# Patient Record
Sex: Male | Born: 1945 | Race: White | Hispanic: No | Smoking: Former smoker
Health system: Southern US, Community
[De-identification: ages and names within clinical notes are randomized; demographics above are authoritative.]

## PROBLEM LIST (undated history)

## (undated) DIAGNOSIS — Z87442 Personal history of urinary calculi: Secondary | ICD-10-CM

## (undated) DIAGNOSIS — D4959 Neoplasm of unspecified behavior of other genitourinary organ: Secondary | ICD-10-CM

## (undated) DIAGNOSIS — S3992XA Unspecified injury of lower back, initial encounter: Secondary | ICD-10-CM

## (undated) DIAGNOSIS — N189 Chronic kidney disease, unspecified: Secondary | ICD-10-CM

## (undated) DIAGNOSIS — Z9221 Personal history of antineoplastic chemotherapy: Secondary | ICD-10-CM

## (undated) DIAGNOSIS — T7840XA Allergy, unspecified, initial encounter: Secondary | ICD-10-CM

## (undated) DIAGNOSIS — C8330 Diffuse large B-cell lymphoma, unspecified site: Secondary | ICD-10-CM

## (undated) DIAGNOSIS — N400 Enlarged prostate without lower urinary tract symptoms: Secondary | ICD-10-CM

## (undated) DIAGNOSIS — Z923 Personal history of irradiation: Secondary | ICD-10-CM

## (undated) DIAGNOSIS — N452 Orchitis: Secondary | ICD-10-CM

## (undated) DIAGNOSIS — C801 Malignant (primary) neoplasm, unspecified: Secondary | ICD-10-CM

## (undated) DIAGNOSIS — G542 Cervical root disorders, not elsewhere classified: Secondary | ICD-10-CM

## (undated) HISTORY — DX: Orchitis: N45.2

## (undated) HISTORY — DX: Personal history of antineoplastic chemotherapy: Z92.21

## (undated) HISTORY — DX: Diffuse large B-cell lymphoma, unspecified site: C83.30

## (undated) HISTORY — DX: Benign prostatic hyperplasia without lower urinary tract symptoms: N40.0

## (undated) HISTORY — DX: Allergy, unspecified, initial encounter: T78.40XA

## (undated) HISTORY — DX: Chronic kidney disease, unspecified: N18.9

## (undated) HISTORY — PX: LITHOTRIPSY: SUR834

---

## 1998-07-05 ENCOUNTER — Inpatient Hospital Stay (HOSPITAL_COMMUNITY): Admission: EM | Admit: 1998-07-05 | Discharge: 1998-07-07 | Payer: Self-pay | Admitting: Emergency Medicine

## 1998-07-09 ENCOUNTER — Emergency Department (HOSPITAL_COMMUNITY): Admission: EM | Admit: 1998-07-09 | Discharge: 1998-07-10 | Payer: Self-pay

## 1998-07-11 ENCOUNTER — Observation Stay (HOSPITAL_COMMUNITY): Admission: EM | Admit: 1998-07-11 | Discharge: 1998-07-11 | Payer: Self-pay | Admitting: Emergency Medicine

## 2001-12-15 ENCOUNTER — Encounter: Payer: Self-pay | Admitting: Orthopedic Surgery

## 2001-12-15 ENCOUNTER — Encounter: Admission: RE | Admit: 2001-12-15 | Discharge: 2001-12-15 | Payer: Self-pay | Admitting: Orthopedic Surgery

## 2002-11-17 HISTORY — PX: CYSTOSCOPY/RETROGRADE/URETEROSCOPY/STONE EXTRACTION WITH BASKET: SHX5317

## 2004-07-05 ENCOUNTER — Ambulatory Visit (HOSPITAL_COMMUNITY): Admission: RE | Admit: 2004-07-05 | Discharge: 2004-07-05 | Payer: Self-pay | Admitting: Neurosurgery

## 2004-08-30 ENCOUNTER — Emergency Department (HOSPITAL_COMMUNITY): Admission: EM | Admit: 2004-08-30 | Discharge: 2004-08-30 | Payer: Self-pay | Admitting: Emergency Medicine

## 2007-12-20 ENCOUNTER — Emergency Department (HOSPITAL_COMMUNITY): Admission: EM | Admit: 2007-12-20 | Discharge: 2007-12-20 | Payer: Self-pay | Admitting: Emergency Medicine

## 2011-07-22 ENCOUNTER — Other Ambulatory Visit: Payer: Self-pay | Admitting: Family Medicine

## 2011-07-22 DIAGNOSIS — N508 Other specified disorders of male genital organs: Secondary | ICD-10-CM

## 2011-07-24 ENCOUNTER — Ambulatory Visit
Admission: RE | Admit: 2011-07-24 | Discharge: 2011-07-24 | Disposition: A | Discharge status: Home / Self Care | Payer: Managed Care, Other (non HMO) | Source: Ambulatory Visit | Attending: Family Medicine | Admitting: Family Medicine

## 2011-07-24 DIAGNOSIS — N508 Other specified disorders of male genital organs: Secondary | ICD-10-CM

## 2011-08-08 LAB — URINE MICROSCOPIC-ADD ON

## 2011-08-08 LAB — URINALYSIS, ROUTINE W REFLEX MICROSCOPIC
Leukocytes, UA: NEGATIVE
Nitrite: NEGATIVE
Specific Gravity, Urine: 1.026
pH: 5.5

## 2011-08-08 LAB — POCT URINE HEMOGLOBIN: Operator id: 25670

## 2011-10-14 ENCOUNTER — Other Ambulatory Visit (HOSPITAL_COMMUNITY): Payer: Self-pay | Admitting: Urology

## 2011-10-14 DIAGNOSIS — D4959 Neoplasm of unspecified behavior of other genitourinary organ: Secondary | ICD-10-CM

## 2011-10-15 ENCOUNTER — Ambulatory Visit (HOSPITAL_COMMUNITY)
Admission: RE | Admit: 2011-10-15 | Discharge: 2011-10-15 | Disposition: A | Discharge status: Home / Self Care | Payer: Managed Care, Other (non HMO) | Source: Ambulatory Visit | Attending: Urology | Admitting: Urology

## 2011-10-15 DIAGNOSIS — I861 Scrotal varices: Secondary | ICD-10-CM | POA: Insufficient documentation

## 2011-10-15 DIAGNOSIS — N5089 Other specified disorders of the male genital organs: Secondary | ICD-10-CM | POA: Insufficient documentation

## 2011-10-15 DIAGNOSIS — N433 Hydrocele, unspecified: Secondary | ICD-10-CM | POA: Insufficient documentation

## 2011-10-15 DIAGNOSIS — D4959 Neoplasm of unspecified behavior of other genitourinary organ: Secondary | ICD-10-CM

## 2011-10-20 ENCOUNTER — Other Ambulatory Visit (HOSPITAL_COMMUNITY): Payer: Self-pay | Admitting: Urology

## 2011-10-20 DIAGNOSIS — D4959 Neoplasm of unspecified behavior of other genitourinary organ: Secondary | ICD-10-CM

## 2011-10-28 ENCOUNTER — Ambulatory Visit (HOSPITAL_COMMUNITY)
Admission: RE | Admit: 2011-10-28 | Discharge: 2011-10-28 | Disposition: A | Discharge status: Home / Self Care | Payer: Managed Care, Other (non HMO) | Source: Ambulatory Visit | Attending: Urology | Admitting: Urology

## 2011-10-28 ENCOUNTER — Other Ambulatory Visit (HOSPITAL_COMMUNITY): Payer: Managed Care, Other (non HMO)

## 2011-10-28 DIAGNOSIS — I861 Scrotal varices: Secondary | ICD-10-CM | POA: Insufficient documentation

## 2011-10-28 DIAGNOSIS — N508 Other specified disorders of male genital organs: Secondary | ICD-10-CM | POA: Insufficient documentation

## 2011-10-28 DIAGNOSIS — D4959 Neoplasm of unspecified behavior of other genitourinary organ: Secondary | ICD-10-CM

## 2011-10-28 MED ORDER — GADOBENATE DIMEGLUMINE 529 MG/ML IV SOLN: 20.0000 mL | Freq: Once | INTRAVENOUS | Status: AC | PRN

## 2011-10-29 ENCOUNTER — Other Ambulatory Visit: Payer: Self-pay | Admitting: Urology

## 2011-10-30 ENCOUNTER — Encounter (HOSPITAL_BASED_OUTPATIENT_CLINIC_OR_DEPARTMENT_OTHER): Payer: Self-pay | Admitting: *Deleted

## 2011-10-30 NOTE — H&P (Addendum)
Urology Admission H&P  Chief Complaint: left testicle swelling  History of Present Illness:   65 y/o white male presents for follow up left testicular swelling.  He first noted the testicular swelling and tenderness Sept 2012. He was initially evaluated with his PCP.  He had a scrotal U/S at [facility] 07/24/11 which showed blood flow bilateral testicles, normal epididymes and the left testicle appeared larger than the right and with increased blood flow as compared to the right testicle.  No intratesticular lesions were noted and this was felt most likely orchitis.  He was given Cipro 500mg  po BID x10 days then Bactrim DS for 3 weeks. His pain improved but testicle remained indurated. His UA was clear. He has a history of stones with prior surgery needed for stones. 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 No vasectomy, scrotal or prostate surgery. The patient has chosen a surveillance approach, but his testicle has remained hard and enlarged and his imaging has progressed from indicative of infection to neoplasm ([place] -> [place] -> [place] -> MRI).   PMH: nephrolithiasis PSH: lithotripsy  Home Medications:  Advil, ASA  Allergies: morphine, motrin  No family history on file. Social History: former smoker, divorced, caffeine use, retired Review of Systems  All other systems reviewed and are negative.    Physical Exam:  Vital signs in last 24 hours:   Physical Exam  Vitals reviewed. Constitutional: He is oriented to person, place, and time. He appears well-developed and well-nourished.  HENT:  Head: Normocephalic.  Eyes: Pupils are equal, round, and reactive to light.  Neck: Normal range of motion. Neck supple.  Cardiovascular: Normal rate and normal heart sounds.   Respiratory: Effort normal and breath sounds normal.  GI: Soft. There is no tenderness.  Genitourinary: Penis normal.  Neurological: He is alert and oriented to person,  place, and time.  Skin: Skin is warm and dry.  Psychiatric: He has a normal mood and affect. His behavior is normal.  Scrotum: Left testicle is very hard and enlarged. Right testicle normal without mass.   Laboratory Data:  CMP, AFP, LDH normal in office, 10/30/2011   Impression/Assessment:  Left testicle neoplasm  Plan:  I discussed with the patient the nature, potential benefits, risks and alternatives to left inguinal exploration, testicle biopsy with frozen section and left radical inguinal orchiectomy, including side effects of the proposed treatment, the likelihood of the patient achieving the goals of the procedure, and any potential problems that might occur during the procedure or recuperation. We discussed if cancer on frozen we will take the entire testicle. He wanted to know if I could wake him up to tell him I was going to remove the entire testicle and we discussed we needed to make that decision now. He said he didn't want to lose the testicle and that he wanted to keep it if the frozens are negative. We even frozen sections can be falsely negative and miss cancer. This may delay diagnosis, allow for cancer to progress, be more difficult to treat in the future and/or be incurable. All questions answered. Patient elects to proceed. Again, he wants to keep testicle if frozens are negative.    Antony Haste  H&P update - pt seen and examined today. No change in H&P.

## 2011-10-30 NOTE — Progress Notes (Signed)
NPO AFTER MN, INCLUDING DO NOT CHEW TOBACCO.  PT ARRIVES AT 0615. NEEDS HG AND EKG.

## 2011-10-31 ENCOUNTER — Encounter (HOSPITAL_BASED_OUTPATIENT_CLINIC_OR_DEPARTMENT_OTHER): Payer: Self-pay | Admitting: *Deleted

## 2011-10-31 ENCOUNTER — Ambulatory Visit (HOSPITAL_BASED_OUTPATIENT_CLINIC_OR_DEPARTMENT_OTHER)
Admission: RE | Admit: 2011-10-31 | Discharge: 2011-10-31 | Disposition: A | Discharge status: Home / Self Care | Payer: Managed Care, Other (non HMO) | Source: Ambulatory Visit | Attending: Urology | Admitting: Urology

## 2011-10-31 ENCOUNTER — Encounter (HOSPITAL_BASED_OUTPATIENT_CLINIC_OR_DEPARTMENT_OTHER)
Admission: RE | Disposition: A | Discharge status: Home / Self Care | Payer: Self-pay | Source: Ambulatory Visit | Attending: Urology

## 2011-10-31 ENCOUNTER — Other Ambulatory Visit: Payer: Self-pay | Admitting: Urology

## 2011-10-31 ENCOUNTER — Other Ambulatory Visit: Payer: Self-pay

## 2011-10-31 ENCOUNTER — Encounter (HOSPITAL_BASED_OUTPATIENT_CLINIC_OR_DEPARTMENT_OTHER): Payer: Self-pay | Admitting: Anesthesiology

## 2011-10-31 ENCOUNTER — Ambulatory Visit (HOSPITAL_BASED_OUTPATIENT_CLINIC_OR_DEPARTMENT_OTHER): Payer: Managed Care, Other (non HMO) | Admitting: Anesthesiology

## 2011-10-31 DIAGNOSIS — Z87442 Personal history of urinary calculi: Secondary | ICD-10-CM | POA: Insufficient documentation

## 2011-10-31 DIAGNOSIS — N5089 Other specified disorders of the male genital organs: Secondary | ICD-10-CM | POA: Insufficient documentation

## 2011-10-31 DIAGNOSIS — D4959 Neoplasm of unspecified behavior of other genitourinary organ: Secondary | ICD-10-CM | POA: Insufficient documentation

## 2011-10-31 HISTORY — DX: Personal history of urinary calculi: Z87.442

## 2011-10-31 HISTORY — DX: Cervical root disorders, not elsewhere classified: G54.2

## 2011-10-31 HISTORY — PX: TESTICLE BIOPSY: SHX471

## 2011-10-31 HISTORY — DX: Neoplasm of unspecified behavior of other genitourinary organ: D49.59

## 2011-10-31 SURGERY — BIOPSY, TESTICLE
Anesthesia: General | Site: Scrotum | Laterality: Left | Wound class: Clean Contaminated

## 2011-10-31 MED ORDER — BUPIVACAINE HCL 0.25 % IJ SOLN
INTRAMUSCULAR | Status: DC | PRN
  Administered 2011-10-31: 10 mL

## 2011-10-31 MED ORDER — DEXAMETHASONE SODIUM PHOSPHATE 4 MG/ML IJ SOLN
INTRAMUSCULAR | Status: DC | PRN
  Administered 2011-10-31: 4 mg via INTRAVENOUS

## 2011-10-31 MED ORDER — LACTATED RINGERS IV SOLN: INTRAVENOUS | Status: DC

## 2011-10-31 MED ORDER — PROPOFOL 10 MG/ML IV EMUL
INTRAVENOUS | Status: DC | PRN
  Administered 2011-10-31: 200 mg via INTRAVENOUS

## 2011-10-31 MED ORDER — HYDROMORPHONE HCL PF 1 MG/ML IJ SOLN
0.5000 mg | INTRAMUSCULAR | Status: DC | PRN
  Administered 2011-10-31: 0.5 mg via INTRAVENOUS

## 2011-10-31 MED ORDER — LACTATED RINGERS IV SOLN
INTRAVENOUS | Status: DC
  Administered 2011-10-31 (×3): via INTRAVENOUS

## 2011-10-31 MED ORDER — PROMETHAZINE HCL 25 MG/ML IJ SOLN: 6.2500 mg | INTRAMUSCULAR | Status: DC | PRN

## 2011-10-31 MED ORDER — SULFAMETHOXAZOLE-TRIMETHOPRIM 800-160 MG PO TABS: 1.0000 | ORAL_TABLET | Freq: Two times a day (BID) | ORAL | Status: AC

## 2011-10-31 MED ORDER — 0.9 % SODIUM CHLORIDE (POUR BTL) OPTIME
TOPICAL | Status: DC | PRN
  Administered 2011-10-31: 500 mL

## 2011-10-31 MED ORDER — MEPERIDINE HCL 25 MG/ML IJ SOLN: 6.2500 mg | INTRAMUSCULAR | Status: DC | PRN

## 2011-10-31 MED ORDER — LIDOCAINE HCL (CARDIAC) 20 MG/ML IV SOLN
INTRAVENOUS | Status: DC | PRN
  Administered 2011-10-31: 80 mg via INTRAVENOUS

## 2011-10-31 MED ORDER — ONDANSETRON HCL 4 MG/2ML IJ SOLN
INTRAMUSCULAR | Status: DC | PRN
  Administered 2011-10-31: 4 mg via INTRAVENOUS

## 2011-10-31 MED ORDER — OXYCODONE-ACETAMINOPHEN 5-325 MG PO TABS: 1.0000 | ORAL_TABLET | ORAL | Status: AC | PRN

## 2011-10-31 MED ORDER — CEFAZOLIN SODIUM 1-5 GM-% IV SOLN
1.0000 g | INTRAVENOUS | Status: AC
Start: 2011-10-31 — End: 2011-10-31
  Administered 2011-10-31: 2 g via INTRAVENOUS

## 2011-10-31 MED ORDER — FENTANYL CITRATE 0.05 MG/ML IJ SOLN
INTRAMUSCULAR | Status: DC | PRN
  Administered 2011-10-31 (×3): 25 ug via INTRAVENOUS
  Administered 2011-10-31: 50 ug via INTRAVENOUS
  Administered 2011-10-31: 25 ug via INTRAVENOUS

## 2011-10-31 MED ORDER — ACETAMINOPHEN 10 MG/ML IV SOLN
1000.0000 mg | Freq: Four times a day (QID) | INTRAVENOUS | Status: DC
  Administered 2011-10-31: 1000 mg via INTRAVENOUS

## 2011-10-31 MED ORDER — FENTANYL CITRATE 0.05 MG/ML IJ SOLN
25.0000 ug | INTRAMUSCULAR | Status: DC | PRN
  Administered 2011-10-31: 25 ug via INTRAVENOUS
  Administered 2011-10-31: 100 ug via INTRAVENOUS
  Administered 2011-10-31: 50 ug via INTRAVENOUS
  Administered 2011-10-31: 25 ug via INTRAVENOUS

## 2011-10-31 MED ORDER — ASPIRIN 81 MG PO TABS: 81.0000 mg | ORAL_TABLET | Freq: Every day | ORAL | Status: DC

## 2011-10-31 SURGICAL SUPPLY — 61 items
ADH SKN CLS APL DERMABOND .7 (GAUZE/BANDAGES/DRESSINGS) ×2
APL SKNCLS STERI-STRIP NONHPOA (GAUZE/BANDAGES/DRESSINGS)
APPLICATOR COTTON TIP 6IN STRL (MISCELLANEOUS) IMPLANT
BANDAGE GAUZE ELAST BULKY 4 IN (GAUZE/BANDAGES/DRESSINGS) ×3 IMPLANT
BENZOIN TINCTURE PRP APPL 2/3 (GAUZE/BANDAGES/DRESSINGS) IMPLANT
BLADE HEX COATED 2.75 (ELECTRODE) ×1 IMPLANT
BLADE SURG 15 STRL LF DISP TIS (BLADE) ×2 IMPLANT
BLADE SURG 15 STRL SS (BLADE) ×3
BLADE SURG ROTATE 9660 (MISCELLANEOUS) ×5 IMPLANT
CLOTH BEACON ORANGE TIMEOUT ST (SAFETY) ×3 IMPLANT
CONT SPEC 4OZ CLIKSEAL STRL BL (MISCELLANEOUS) ×2 IMPLANT
COVER MAYO STAND STRL (DRAPES) ×3 IMPLANT
COVER TABLE BACK 60X90 (DRAPES) ×3 IMPLANT
DERMABOND ADVANCED (GAUZE/BANDAGES/DRESSINGS) ×1
DERMABOND ADVANCED .7 DNX12 (GAUZE/BANDAGES/DRESSINGS) ×1 IMPLANT
DRAIN PENROSE 18X1/2 LTX STRL (DRAIN) ×2 IMPLANT
DRAPE PED LAPAROTOMY (DRAPES) ×3 IMPLANT
DRSG TEGADERM 4X4.75 (GAUZE/BANDAGES/DRESSINGS) IMPLANT
ELECT REM PT RETURN 9FT ADLT (ELECTROSURGICAL) ×3
ELECTRODE REM PT RTRN 9FT ADLT (ELECTROSURGICAL) ×2 IMPLANT
GAUZE SPONGE 4X4 16PLY XRAY LF (GAUZE/BANDAGES/DRESSINGS) ×2 IMPLANT
GLOVE BIO SURGEON STRL SZ7.5 (GLOVE) ×5 IMPLANT
GLOVE BIOGEL PI IND STRL 6.5 (GLOVE) ×3 IMPLANT
GLOVE BIOGEL PI INDICATOR 6.5 (GLOVE) ×3
GOWN PREVENTION PLUS LG XLONG (DISPOSABLE) ×5 IMPLANT
GOWN STRL REIN XL XLG (GOWN DISPOSABLE) ×3 IMPLANT
IV NS IRRIG 3000ML ARTHROMATIC (IV SOLUTION) IMPLANT
NEEDLE HYPO 22GX1.5 SAFETY (NEEDLE) IMPLANT
NS IRRIG 500ML POUR BTL (IV SOLUTION) ×2 IMPLANT
PACK BASIN DAY SURGERY FS (CUSTOM PROCEDURE TRAY) ×3 IMPLANT
PENCIL BUTTON HOLSTER BLD 10FT (ELECTRODE) ×3 IMPLANT
SET BERKELEY SUCTION TUBING (SUCTIONS) ×2 IMPLANT
SET COLLECT BLD 21X3/4 12 (NEEDLE) ×3 IMPLANT
SPONGE INTESTINAL PEANUT (DISPOSABLE) ×2 IMPLANT
SPONGE LAP 4X18 X RAY DECT (DISPOSABLE) ×2 IMPLANT
STRIP CLOSURE SKIN 1/2X4 (GAUZE/BANDAGES/DRESSINGS) IMPLANT
STRIP CLOSURE SKIN 1/4X4 (GAUZE/BANDAGES/DRESSINGS) IMPLANT
SUPPORT SCROTAL LG STRP (MISCELLANEOUS) IMPLANT
SUPPORT SCROTAL LRG NO STRP (SOFTGOODS) ×2 IMPLANT
SUT CHROMIC 3 0 SH 27 (SUTURE) ×2 IMPLANT
SUT MNCRL AB 4-0 PS2 18 (SUTURE) ×2 IMPLANT
SUT PDS AB 4-0 RB1 27 (SUTURE) ×2 IMPLANT
SUT SILK 2 0 SH (SUTURE) IMPLANT
SUT SILK 2 0 TIES 17X18 (SUTURE)
SUT SILK 2-0 18XBRD TIE BLK (SUTURE) IMPLANT
SUT SILK 3 0 SH 30 (SUTURE) IMPLANT
SUT VIC AB 0 SH 27 (SUTURE) IMPLANT
SUT VIC AB 2-0 CT2 27 (SUTURE) IMPLANT
SUT VIC AB 2-0 UR5 27 (SUTURE) IMPLANT
SUT VIC AB 3-0 SH 27 (SUTURE) ×3
SUT VIC AB 3-0 SH 27X BRD (SUTURE) ×1 IMPLANT
SUT VIC AB 4-0 SH 27 (SUTURE) ×3
SUT VIC AB 4-0 SH 27XANBCTRL (SUTURE) ×1 IMPLANT
SUT VICRYL 0 TIES 12 18 (SUTURE) IMPLANT
SYR 30ML LL (SYRINGE) IMPLANT
SYR BULB IRRIGATION 50ML (SYRINGE) ×2 IMPLANT
SYR CONTROL 10ML LL (SYRINGE) IMPLANT
TOWEL OR 17X24 6PK STRL BLUE (TOWEL DISPOSABLE) ×6 IMPLANT
TRAY DSU PREP LF (CUSTOM PROCEDURE TRAY) ×3 IMPLANT
WATER STERILE IRR 500ML POUR (IV SOLUTION) ×3 IMPLANT
YANKAUER SUCT BULB TIP NO VENT (SUCTIONS) ×2 IMPLANT

## 2011-10-31 NOTE — Transfer of Care (Signed)
Immediate Anesthesia Transfer of Care Note  Patient: [name]  Procedure(s) Performed:  BIOPSY TESTICULAR  Patient Location: Patient transported to PACU with oxygen via face mask at 6 Liters / Min  Anesthesia Type: General  Level of Consciousness: awake and alert   Airway & Oxygen Therapy: Patient Spontanous Breathing and Patient connected to face mask oxygen Post-op Assessment: Report given to PACU RN and Post -op Vital signs reviewed and stable  Post vital signs: Reviewed and stable  Complications: No apparent anesthesia complications

## 2011-10-31 NOTE — Op Note (Signed)
[name], [name] NO.:  [id number]  MEDICAL RECORD NO.:  [id number]  LOCATION:                               FACILITY:  [facility]  PHYSICIAN:  Jerilee Field, MD   DATE OF BIRTH:  [birth year]  DATE OF PROCEDURE: DATE OF DISCHARGE:                              OPERATIVE REPORT   PREOPERATIVE DIAGNOSIS:  Left testicular neoplasm.  POSTOPERATIVE DIAGNOSIS:  Left testicular neoplasm.  PROCEDURE:  Left inguinal testicular exploration with open testicular biopsy.  SURGEON:  Jerilee Field, MD  TYPE OF ANESTHESIA:  General with a local block.  INDICATION FOR PROCEDURE:  Mr. [name] is a 65 year old white male, presented with an enlarged, hard and indurated left testicle in October 2012.  Serial history, exams and imaging have been equivocal between a benign/inflammatory versus a neoplastic/malignant process.  I have had multiple discussions with the patient after each exam and imaging tests documented in the chart explaining the concern for malignancy with the possibility of a benign process.  I reviewed with him his ultrasound images in the past and reviewed with him this morning his MRI images.  We discussed that whatever process is involving the left testicle, it is involving most of it and that testicle is likely not functioning normally. The patient has been adamant through our various discussions that he absolutely does not want to lose the testicle if it is a benign process. We discussed today the nature, risks, benefits, and alternatives to a left inguinal approach with open testicular biopsy, frozen sections, and possible radical inguinal orchiectomy if he turned out to have primary testicular cancer. We discussed alternatives such as proceeding with radical orchiectomy today or continuing observation/do nothing. We discussed the limitations of a biopsy/parital excision in regards to false positives and false negatives. Again, in the preop area this morning,  the patient said if there were any chance that it was benign, he definitely wants to keep the testicle despite its overall abnormality. He also stated he would like to know the frozen results and be able to participate in a decision whether to remove the entire testicle or not. All questions were answered and the patient elected to proceed with inguinal exploration and testicle biopsy with frozen section exam.  FINDINGS:  On exam under anesthesia, again the right testicle was palpably normal.  The left testicle felt enlarged, hard, and indurated without a specific mass.  The entire testicle felt abnormal.  After it was delivered through the tunica vaginalis, the testicle did feel softer, but again had indurated areas throughout.  On open inspection after opening the tunica albuginea, the testicle appeared without a well defined mass. There was a tan appearance in various spots underneath the tunica albuginea on the medial part of the testicle and this was biopsied primarily.  DESCRIPTION OF PROCEDURE:  After consent was obtained, the patient was brought to the operating room.  A time-out was performed to confirm the patient and procedure.  After adequate anesthesia, an exam was performed with previous findings.  The lower abdomen and left inguinal region were then shaved with clippers.  The lower abdomen and external genitalia were prepped and draped in the usual fashion.  The skin was marked over the left external ring and infiltrated with Marcaine, and a small 2 to 3 cm incision was made.  This was dissected down to the external ring which was visualized and the cord going into the scrotum.  The cord was carefully surrounded with a Penrose twice and cinched down as a tourniquet.  After vascular control of the testicle, it was brought into the wound, however, I would not go through the incision, given its rather large side.  Therefore, the incision was extended slightly on each side and  the testicle delivered.  The tunica vaginalis was then opened and the testicle was examined.  Again, it appeared to be a normal testicle, but palpably had hard indurated areas throughout.  There were some tan-appearing lesions under mainly the medial surface and this area was palpably abnormal.  The tunica albuginea was then opened with a 15 blade.  A wedge of testicular tissue was taken from the medial side which was thought to incorporate some of the abnormal tissue.  The testicle itself had been surrounded with fresh flaps and had tourniquet control.  The testicular biopsy was then sent to Pathology.  The frozen section was consistent with lymphoid tissue and not a primary testicular malignancy.  The pathologist felt it could be a benign or malignant process.  Given the patient's wishes, I felt it was prudent to leave the testicle in place as he may have a benign process. Also, the pathologist said there was plenty of tissue for analysis and he needed no more specimen.  Therefore, the tourniquet was released.  Adequate hemostasis was ensured with figure-of-eight 4-0 Vicryl suture in 2 places.  The tunica albuginea was then closed with a running 4-0 PDS suture.  The testicle and cord were copiously irrigated.  Adequate hemostasis again was ensured with the testicle, I watched it for several minutes after the tourniquet was released; and also on the cut edge of the tunica vaginalis, hemostasis was ensured.  The testicle was then delivered back into the left hemiscrotum without torsion.  The wound was copiously irrigated and the subcutaneous tissue closed with interrupted Vicryl. The skin then closed with Monocryl subcuticular and Dermabond.  He was cleaned up, fluffs and a jockstrap placed.  He was taken to the recovery room in stable condition.  ESTIMATED BLOOD LOSS:  Minimal.  COMPLICATIONS:  None.  DRAINS:  None.  SPECIMENS:  Biopsy limited resection of the right testicle.   Frozen section consistent with lymphoid tissue, possibly a benign process versus lymphoma.  DISPOSITION OF SPECIMEN:  To Pathology.  COUNTS:  Correct.  PATIENT DISPOSITION:  Stable to PACU.          ______________________________ Jerilee Field, MD     ME/MEDQ  D:  10/31/2011  T:  10/31/2011  Job:  [id number]

## 2011-10-31 NOTE — Anesthesia Preprocedure Evaluation (Addendum)
Anesthesia Evaluation  Patient identified by MRN, date of birth, ID band Patient awake    Reviewed: Allergy & Precautions, H&P , NPO status , Patient's Chart, lab work & pertinent test results, reviewed documented beta blocker date and time   Airway Mallampati: II TM Distance: >3 FB Neck ROM: full    Dental No notable dental hx. (+) Caps   Pulmonary neg pulmonary ROS,  clear to auscultation  Pulmonary exam normal       Cardiovascular Exercise Tolerance: Good neg cardio ROS regular Normal    Neuro/Psych  Neuromuscular disease Negative Neurological ROS  Negative Psych ROS   GI/Hepatic negative GI ROS, Neg liver ROS,   Endo/Other  Negative Endocrine ROS  Renal/GU negative Renal ROS  Genitourinary negative   Musculoskeletal   Abdominal   Peds  Hematology negative hematology ROS (+)   Anesthesia Other Findings   Reproductive/Obstetrics negative OB ROS                          Anesthesia Physical Anesthesia Plan  ASA: II  Anesthesia Plan: General   Post-op Pain Management:    Induction:   Airway Management Planned: LMA  Additional Equipment:   Intra-op Plan:   Post-operative Plan:   Informed Consent: I have reviewed the patients History and Physical, chart, labs and discussed the procedure including the risks, benefits and alternatives for the proposed anesthesia with the patient or authorized representative who has indicated his/her understanding and acceptance.   Dental Advisory Given  Plan Discussed with: CRNA  Anesthesia Plan Comments:        Anesthesia Quick Evaluation

## 2011-10-31 NOTE — Addendum Note (Signed)
Addendum  created 10/31/11 1040 by Phillips Grout, MD   Modules edited:Orders

## 2011-10-31 NOTE — Anesthesia Procedure Notes (Signed)
Procedure Name: LMA Insertion Date/Time: 10/31/2011 7:58 AM Performed by: Lorrin Jackson Pre-anesthesia Checklist: Patient identified, Emergency Drugs available, Suction available and Patient being monitored Patient Re-evaluated:Patient Re-evaluated prior to inductionOxygen Delivery Method: Circle System Utilized Preoxygenation: Pre-oxygenation with 100% oxygen Intubation Type: IV induction Ventilation: Mask ventilation without difficulty LMA: LMA with gastric port inserted LMA Size: 4.0 Number of attempts: 1 Placement Confirmation: positive ETCO2 Tube secured with: Tape Dental Injury: Teeth and Oropharynx as per pre-operative assessment

## 2011-10-31 NOTE — Anesthesia Postprocedure Evaluation (Signed)
  Anesthesia Post-op Note  Patient: [name]  Procedure(s) Performed:  BIOPSY TESTICULAR  Patient Location: PACU  Anesthesia Type: General  Level of Consciousness: awake and alert   Airway and Oxygen Therapy: Patient Spontanous Breathing  Post-op Pain: mild  Post-op Assessment: Post-op Vital signs reviewed, Patient's Cardiovascular Status Stable, Respiratory Function Stable, Patent Airway and No signs of Nausea or vomiting  Post-op Vital Signs: stable  Complications: No apparent anesthesia complications

## 2011-10-31 NOTE — Brief Op Note (Signed)
10/31/2011  9:24 AM  PATIENT:  [name]  65 y.o. male  PRE-OPERATIVE DIAGNOSIS:  left testicular neoplasm  POST-OPERATIVE DIAGNOSIS:  left testicular neoplasm  PROCEDURE:  Procedure(s): Left BIOPSY TESTICLE with frozen, left inguinal approach   SURGEON:  Surgeon(s): Antony Haste, MD  ANESTHESIA:   general, local block   FINDINGS - left testicle with infiltrative induration, no specific mass  EBL:  minimal  BLOOD ADMINISTERED:none  DRAINS: none   LOCAL MEDICATIONS USED:  MARCAINE 10 CC  SPECIMEN:  Biopsy / Limited Resection -- Frozen section - spoke with pathologist - Lymphoid tissue - benign vs. malignant  DISPOSITION OF SPECIMEN:  PATHOLOGY  COUNTS:  YES  TOURNIQUET:  * No tourniquets in log *  DICTATION: [id number]  PLAN OF CARE: Discharge to home after PACU  PATIENT DISPOSITION:  PACU - hemodynamically stable.   Delay start of Pharmacological VTE agent (>24hrs) due to surgical blood loss or risk of bleeding:  {YES/NO/NOT APPLICABLE:20182

## 2011-10-31 NOTE — Progress Notes (Signed)
(  1040) Dr Acey Lav notified of pt's cont "9" pain without relief of 1GM tylenol IV & 150 mcq of fentanyl IV. New order noted.

## 2011-11-03 ENCOUNTER — Encounter (HOSPITAL_BASED_OUTPATIENT_CLINIC_OR_DEPARTMENT_OTHER): Payer: Self-pay | Admitting: Urology

## 2011-11-03 ENCOUNTER — Other Ambulatory Visit: Payer: Self-pay | Admitting: Urology

## 2011-11-14 ENCOUNTER — Telehealth: Payer: Self-pay | Admitting: Hematology & Oncology

## 2011-11-14 NOTE — Telephone Encounter (Signed)
Opened in error

## 2011-11-17 ENCOUNTER — Other Ambulatory Visit (HOSPITAL_BASED_OUTPATIENT_CLINIC_OR_DEPARTMENT_OTHER): Payer: Managed Care, Other (non HMO) | Admitting: Lab

## 2011-11-17 ENCOUNTER — Ambulatory Visit: Payer: Managed Care, Other (non HMO)

## 2011-11-17 ENCOUNTER — Ambulatory Visit (HOSPITAL_BASED_OUTPATIENT_CLINIC_OR_DEPARTMENT_OTHER): Payer: Managed Care, Other (non HMO) | Admitting: Hematology & Oncology

## 2011-11-17 ENCOUNTER — Encounter: Payer: Self-pay | Admitting: Hematology & Oncology

## 2011-11-17 VITALS — BP 142/84 | HR 86 | Temp 97.2°F | Ht 71.0 in | Wt 179.0 lb

## 2011-11-17 DIAGNOSIS — C8330 Diffuse large B-cell lymphoma, unspecified site: Secondary | ICD-10-CM

## 2011-11-17 DIAGNOSIS — C8595 Non-Hodgkin lymphoma, unspecified, lymph nodes of inguinal region and lower limb: Secondary | ICD-10-CM

## 2011-11-17 HISTORY — DX: Diffuse large B-cell lymphoma, unspecified site: C83.30

## 2011-11-17 LAB — CBC WITH DIFFERENTIAL (CANCER CENTER ONLY)
BASO#: 0 10e3/uL (ref 0.0–0.2)
BASO%: 0.6 % (ref 0.0–2.0)
EOS%: 2.9 % (ref 0.0–7.0)
Eosinophils Absolute: 0.1 10e3/uL (ref 0.0–0.5)
HCT: 44.6 % (ref 38.7–49.9)
HGB: 15.7 g/dL (ref 13.0–17.1)
LYMPH#: 1.6 10e3/uL (ref 0.9–3.3)
LYMPH%: 33.1 % (ref 14.0–48.0)
MCH: 31.7 pg (ref 28.0–33.4)
MCHC: 35.2 g/dL (ref 32.0–35.9)
MCV: 90 fL (ref 82–98)
MONO#: 0.4 10e3/uL (ref 0.1–0.9)
MONO%: 7.7 % (ref 0.0–13.0)
NEUT#: 2.7 10e3/uL (ref 1.5–6.5)
NEUT%: 55.7 % (ref 40.0–80.0)
Platelets: 220 10e3/uL (ref 145–400)
RBC: 4.95 10e6/uL (ref 4.20–5.70)
RDW: 12.3 % (ref 11.1–15.7)
WBC: 4.8 10e3/uL (ref 4.0–10.0)

## 2011-11-17 LAB — CHCC SATELLITE - SMEAR

## 2011-11-17 LAB — COMPREHENSIVE METABOLIC PANEL
ALT: 38 U/L (ref 0–53)
AST: 23 U/L (ref 0–37)
Creatinine, Ser: 0.97 mg/dL (ref 0.50–1.35)
Total Bilirubin: 0.4 mg/dL (ref 0.3–1.2)

## 2011-11-17 LAB — LACTATE DEHYDROGENASE: LDH: 155 U/L (ref 94–250)

## 2011-11-17 NOTE — Progress Notes (Signed)
CC:   Keith Field, MD [name], M.D.  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  HISTORY OF PRESENT ILLNESS:  [name] is a really nice 65 year old white gentleman.  He has been in very good health.  He is followed by I think Dr.  Lupita Henson.  He lives in [place]. He was a Company secretary for 30 years.  His wife is actually a former Emergency planning/management officer.  He started to note some swelling of the left testicle.  He said this occurred concurrently when he was put on prednisone for "pinched nerve" causing some right arm pain.  He initially was referred to Dr. Karma Greaser of [facility].  He underwent an evaluation.  He had a scrotal ultrasound.  This showed an enlarged left testicle.  This showed increased blood flow.  No intratesticular lesions were noted.  It was felt that he may have had orchitis.  He was given ciprofloxacin.  He did not have any improvement with respect to the testicular swelling. As such, he was taken to surgery.  Of note, he did have tumor markers done.  He had a normal beta HCG and alpha-fetoprotein.  His LDH was 241.  He had normal electrolytes.  He was taken to surgery.  He had an open biopsy done through a left inguinal orchiectomy incision.  The frozen section showed lymphoid tissue that could be "benign or malignant."  Knowing that , Dr. Mena Goes did not feel that an orchiectomy was indicated.  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 It was strongly positive for CD20.  He was subsequently referred to the [facility] for [facility].  He feels well.  He has had no nausea or vomiting.  He has had no abdominal pain.  There is a slight tenderness at the orchiectomy site. He has had no cough.  He has had no fever, sweats, or chills.  He has had no rashes.  There has been no pruritus.  Of note, he did have an MRI of the pelvis.  This was done on 10/28/2011. This did show  an enlarged left testicle.  There was some no extratesticular soft tissue mass.  This appeared to be limited to the scrotal region.  The right testicle appeared normal.  PAST MEDICAL HISTORY:  His past mental history is remarkable for nephrolithiasis.  ALLERGIES: 1. Motrin. 2. Morphine.  CURRENT MEDICATIONS:  Aspirin 81 mg p.o. daily, Advil has a necessary.  SOCIAL HISTORY:  Remarkable for past tobacco use.  He has a 60 pack-year history of tobacco use.  He stopped in 1993.  He has no significant alcohol use.  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  FAMILY HISTORY:  Remarkable for lung cancer in his father.  REVIEW OF SYSTEMS:  Is as stated in the history of present illness.  No additional findings are noted on 12-system review.  PHYSICAL EXAM:  General: This is a well-developed, well-nourished, white gentleman in no obvious distress.  Vital signs: Show a temp of 97.2, pulse 86, respiratory rate 18, blood pressure 142/84, and weight is 179. Head and neck exam shows a normocephalic, atraumatic skull.  There are no ocular or oral lesions.  There are no palpable cervical or supraclavicular lymph nodes.  Lungs are clear to percussion and auscultation bilaterally.  Cardiac exam: Regular rate and rhythm with a normal S1 and S2.  There are no murmurs, rubs, or bruits.  Abdominal exam: Soft with good bowel  sounds.  He has a healing left inguinal orchiectomy incision.  There is no fluid wave.  He has no guarding or rebound tenderness.  There is no palpable hepatosplenomegaly. Axillary exam: Shows no bilateral axillary adenopathy.  Back exam:  No tenderness over the spine, ribs, or hips.  Extremities: Shows no clubbing, cyanosis, or edema.  He has good range motion of his joints.  He has good pulses in his distal extremities.  Scrotal exam: Does show the firm, enlarged left testicle.  The right testicle is unremarkable.  Skin exam: No rashes, ecchymosis, or  petechia.  LABORATORY STUDIES:  Show white cell count of 4.8, hemoglobin 15.7, 44.6, and platelet count 220.  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  I must say this is something that is quite unusual.  He needs to be staged from my point of view.  He needs to have a PET scan done.  He also needs to have a bone marrow biopsy done.  In addition, a lumbar puncture needs to be done as testicular lymphoma does have a predilection for the spread to the central nervous system.  I think 1 big issue is getting the left testicle out.  I believe that a complete orchiectomy would be indicated.  I do realize that radiation therapy is highly successful for testicular lymphoma.  However, I would just feel better having that tumor taken out and then being able to treat him.  The standard approach for treating testicular lymphoma, even if this is limited to the testicle, is systemic chemotherapy followed by radiation and a spinal tap with intrathecal chemotherapy.  Testicular lymphoma is a an aggressive process.  Survival is probably lower for testicular lymphoma than for other diffuse large-cell lymphomas.  [name] is very healthy.  He has an excellent performance status (ECOG 0).He is going to think about things.  He says he feels well.  He is not sure as to why he needs all this treatment.  I explained to him the problem that testicular lymphoma poses and what it can do.  I told him that if it is not treated, it would be difficult to cure him once it did recur.  I would think that the chance of him recurring without therapy is easily over 50%.  I will have to be in touch with Dr. Mena Goes and see about an orchiectomy.  In the meantime, we will have to get [name] set up for his other studies.  We will try to get these done as expeditiously as possible.  I spent a good hour and a half with he had  his wife. They are both very very nice.  Again, he is very healthy and could easily withstand the rigors of therapy for cure.  I fully expect that we can cure him.  Once we get our additional study results back, then we will be able to plan for chemotherapy.    ______________________________ Josph Macho, M.D. PRE/MEDQ  D:  11/17/2011  T:  11/17/2011  Job:  [id number]

## 2011-11-17 NOTE — Progress Notes (Signed)
This office note has been dictated.

## 2011-11-19 ENCOUNTER — Telehealth: Payer: Self-pay | Admitting: *Deleted

## 2011-11-19 NOTE — Progress Notes (Signed)
Addended by: Arlan Organ R on: 11/19/2011 09:28 AM   Modules accepted: Orders

## 2011-11-19 NOTE — Progress Notes (Signed)
Addended by: Arlan Organ R on: 11/19/2011 10:21 AM   Modules accepted: Orders

## 2011-11-19 NOTE — Telephone Encounter (Signed)
Pt aware of 11-24-11 PET and MUGA scans. Elease Hashimoto will call Pt with 1-10 Biopsy and LP appointment times and instructions. Bonita Quin is aware to pre-cert

## 2011-11-20 ENCOUNTER — Telehealth: Payer: Self-pay | Admitting: *Deleted

## 2011-11-20 ENCOUNTER — Other Ambulatory Visit: Payer: Self-pay | Admitting: Hematology & Oncology

## 2011-11-20 ENCOUNTER — Encounter: Payer: Self-pay | Admitting: Hematology & Oncology

## 2011-11-20 DIAGNOSIS — C8330 Diffuse large B-cell lymphoma, unspecified site: Secondary | ICD-10-CM

## 2011-11-20 NOTE — Telephone Encounter (Signed)
Left pt message to call his appointments have changed. MD aware MUGA was canceled and echo was approved. Echo is 11-26-11 at 10am [facility] admitting

## 2011-11-21 ENCOUNTER — Encounter (HOSPITAL_COMMUNITY): Payer: Self-pay

## 2011-11-24 ENCOUNTER — Other Ambulatory Visit (HOSPITAL_COMMUNITY): Payer: Managed Care, Other (non HMO) | Admitting: Radiology

## 2011-11-24 ENCOUNTER — Encounter (HOSPITAL_COMMUNITY): Payer: Self-pay

## 2011-11-24 ENCOUNTER — Encounter (HOSPITAL_COMMUNITY)
Admission: RE | Admit: 2011-11-24 | Discharge: 2011-11-24 | Disposition: A | Discharge status: Home / Self Care | Payer: Managed Care, Other (non HMO) | Source: Ambulatory Visit | Attending: Hematology & Oncology | Admitting: Hematology & Oncology

## 2011-11-24 ENCOUNTER — Other Ambulatory Visit: Payer: Self-pay | Admitting: Radiology

## 2011-11-24 DIAGNOSIS — Q742 Other congenital malformations of lower limb(s), including pelvic girdle: Secondary | ICD-10-CM | POA: Insufficient documentation

## 2011-11-24 DIAGNOSIS — C8589 Other specified types of non-Hodgkin lymphoma, extranodal and solid organ sites: Secondary | ICD-10-CM | POA: Insufficient documentation

## 2011-11-24 DIAGNOSIS — C8583 Other specified types of non-Hodgkin lymphoma, intra-abdominal lymph nodes: Secondary | ICD-10-CM | POA: Insufficient documentation

## 2011-11-24 DIAGNOSIS — C8330 Diffuse large B-cell lymphoma, unspecified site: Secondary | ICD-10-CM

## 2011-11-24 DIAGNOSIS — N200 Calculus of kidney: Secondary | ICD-10-CM | POA: Insufficient documentation

## 2011-11-24 DIAGNOSIS — N400 Enlarged prostate without lower urinary tract symptoms: Secondary | ICD-10-CM | POA: Insufficient documentation

## 2011-11-24 DIAGNOSIS — K7689 Other specified diseases of liver: Secondary | ICD-10-CM | POA: Insufficient documentation

## 2011-11-24 DIAGNOSIS — I2510 Atherosclerotic heart disease of native coronary artery without angina pectoris: Secondary | ICD-10-CM | POA: Insufficient documentation

## 2011-11-24 MED ORDER — FLUDEOXYGLUCOSE F - 18 (FDG) INJECTION
17.0000 | Freq: Once | INTRAVENOUS | Status: AC | PRN
  Administered 2011-11-24: 17 via INTRAVENOUS

## 2011-11-26 ENCOUNTER — Ambulatory Visit (HOSPITAL_COMMUNITY)
Admission: RE | Admit: 2011-11-26 | Discharge: 2011-11-26 | Disposition: A | Discharge status: Home / Self Care | Payer: Managed Care, Other (non HMO) | Source: Ambulatory Visit | Attending: Hematology & Oncology | Admitting: Hematology & Oncology

## 2011-11-26 ENCOUNTER — Other Ambulatory Visit: Payer: Self-pay | Admitting: Urology

## 2011-11-26 ENCOUNTER — Other Ambulatory Visit (HOSPITAL_COMMUNITY): Payer: Self-pay | Admitting: Physician Assistant

## 2011-11-26 DIAGNOSIS — Z01818 Encounter for other preprocedural examination: Secondary | ICD-10-CM | POA: Insufficient documentation

## 2011-11-26 DIAGNOSIS — C8589 Other specified types of non-Hodgkin lymphoma, extranodal and solid organ sites: Secondary | ICD-10-CM | POA: Insufficient documentation

## 2011-11-26 DIAGNOSIS — C8330 Diffuse large B-cell lymphoma, unspecified site: Secondary | ICD-10-CM

## 2011-11-26 DIAGNOSIS — Z5111 Encounter for antineoplastic chemotherapy: Secondary | ICD-10-CM

## 2011-11-26 NOTE — Progress Notes (Signed)
*  PRELIMINARY RESULTS* Echocardiogram 2D Echocardiogram has been performed.  Glean Salen [facility] 11/26/2011, 1:01 PM

## 2011-11-27 ENCOUNTER — Encounter (HOSPITAL_COMMUNITY): Payer: Self-pay

## 2011-11-27 ENCOUNTER — Inpatient Hospital Stay (HOSPITAL_COMMUNITY)
Admission: RE | Admit: 2011-11-27 | Discharge: 2011-11-27 | Disposition: A | Discharge status: Home / Self Care | Payer: Managed Care, Other (non HMO) | Source: Ambulatory Visit

## 2011-11-27 ENCOUNTER — Ambulatory Visit (HOSPITAL_COMMUNITY)
Admission: RE | Admit: 2011-11-27 | Discharge: 2011-11-27 | Disposition: A | Discharge status: Home / Self Care | Payer: Managed Care, Other (non HMO) | Source: Ambulatory Visit | Attending: Hematology & Oncology | Admitting: Hematology & Oncology

## 2011-11-27 ENCOUNTER — Other Ambulatory Visit: Payer: Self-pay | Admitting: Radiology

## 2011-11-27 ENCOUNTER — Other Ambulatory Visit: Payer: Self-pay | Admitting: Interventional Radiology

## 2011-11-27 DIAGNOSIS — C8330 Diffuse large B-cell lymphoma, unspecified site: Secondary | ICD-10-CM

## 2011-11-27 DIAGNOSIS — C8589 Other specified types of non-Hodgkin lymphoma, extranodal and solid organ sites: Secondary | ICD-10-CM | POA: Insufficient documentation

## 2011-11-27 HISTORY — PX: BONE MARROW ASPIRATE AND BIOPSY WIITH LUMBAR PUNCTURE: SHX1250

## 2011-11-27 LAB — CBC
MCH: 31.6 pg (ref 26.0–34.0)
MCHC: 35.3 g/dL (ref 30.0–36.0)
Platelets: 165 10*3/uL (ref 150–400)
RBC: 4.72 MIL/uL (ref 4.22–5.81)
RDW: 12.6 % (ref 11.5–15.5)

## 2011-11-27 LAB — PROTIME-INR: Prothrombin Time: 15.8 seconds — ABNORMAL HIGH (ref 11.6–15.2)

## 2011-11-27 MED ORDER — SODIUM CHLORIDE 0.9 % IV SOLN
INTRAVENOUS | Status: DC
  Administered 2011-11-27: 500 mL via INTRAVENOUS

## 2011-11-27 MED ORDER — FENTANYL CITRATE 0.05 MG/ML IJ SOLN
INTRAMUSCULAR | Status: AC | PRN
  Administered 2011-11-27: 100 ug via INTRAVENOUS

## 2011-11-27 MED ORDER — MIDAZOLAM HCL 5 MG/5ML IJ SOLN
INTRAMUSCULAR | Status: AC | PRN
  Administered 2011-11-27: 2 mg via INTRAVENOUS

## 2011-11-27 NOTE — Procedures (Signed)
LP performed with fluoro guidance at L2/3. OP 18 cm/h20. 15 cc clear CSF collected.

## 2011-11-27 NOTE — Progress Notes (Signed)
Back from CT bone marrow biopsy. To go to lumbar puncture at 1100.

## 2011-11-27 NOTE — Progress Notes (Signed)
Pt lying flat post lumbar puncture procedure in [facility]. Eating sandwich without problem

## 2011-11-27 NOTE — H&P (Signed)
Agree 

## 2011-11-27 NOTE — Procedures (Signed)
Procedure:  CT guided bone marrow biopsy Right posterior iliac aspirate and core biopsy No complications

## 2011-11-27 NOTE — H&P (Signed)
[name] is an 66 y.o. male.   Chief Complaint: left testicular lymphoma HPI: Patient with history of large cell lymphoma of left testicle presents today  for elective CT guided bone marrow biopsy followed by lumbar puncture this afternoon.  Past Medical History  Diagnosis Date  . History of kidney stones   . Testicular neoplasm left  . Pinched cervical nerve root     occasional symptoms  . 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 Cystoscopy/retrograde/ureteroscopy/stone extraction with basket 2004  . Testicle biopsy 10/31/2011    Procedure: BIOPSY TESTICULAR;  Surgeon: Antony Haste, MD;  Location: [facility];  Service: Urology;  Laterality: Left;   Family History:lung cancer in father Social History:  reports that he quit smoking about 17 years ago. His smoking use included Cigarettes. He has a 50 pack-year smoking history. His smokeless tobacco use includes Chew. He reports that he drinks alcohol. He reports that he does not use illicit drugs.  Allergies:  Allergies  Allergen Reactions  . Morphine And Related Itching  . Motrin (Ibuprofen) Nausea Only    Medications Prior to Admission  Medication Sig Dispense Refill  . aspirin 81 MG tablet Take 1 tablet (81 mg total) by mouth daily.       Medications Prior to Admission  Medication Dose Route Frequency Provider Last Rate Last Dose  . 0.9 %  sodium chloride infusion   Intravenous Continuous Robet Leu, PA 20 mL/hr at 11/27/11 0755 500 mL at 11/27/11 0755    Results for orders placed during the hospital encounter of 11/27/11 (from the past 48 hour(s))  APTT     Status: Abnormal   Collection Time   11/27/11  7:40 AM      Component Value Range Comment   aPTT 41 (*) 24 - 37 (seconds)   CBC     Status: Normal   Collection Time   11/27/11  7:40 AM      Component Value Range Comment   WBC 4.4  4.0 - 10.5 (K/uL)    RBC 4.72  4.22 - 5.81 (MIL/uL)      Hemoglobin 14.9  13.0 - 17.0 (g/dL)    HCT 95.6  21.3 - 08.6 (%)    MCV 89.4  78.0 - 100.0 (fL)    MCH 31.6  26.0 - 34.0 (pg)    MCHC 35.3  30.0 - 36.0 (g/dL)    RDW 57.8  46.9 - 62.9 (%)    Platelets 165  150 - 400 (K/uL)   PROTIME-INR     Status: Abnormal   Collection Time   11/27/11  7:40 AM      Component Value Range Comment   Prothrombin Time 15.8 (*) 11.6 - 15.2 (seconds)    INR 1.23  0.00 - 1.49     No results found.  Review of Systems  Constitutional: Negative for fever and chills.  Respiratory: Negative for cough and shortness of breath.   Cardiovascular: Negative for chest pain.  Gastrointestinal: Negative for nausea, vomiting and abdominal pain.  Neurological: Negative for headaches.  Endo/Heme/Allergies: Does not bruise/bleed easily.    Blood pressure 129/81, pulse 75, temperature 96.7 F (35.9 C), temperature source Oral, resp. rate 18, height 5\' 11"  (1.803 m), weight 180 lb (81.647 kg), SpO2 95.00%. Physical Exam  Constitutional: He is oriented to person, place, and time. He appears well-developed and well-nourished.  Cardiovascular: Normal rate and regular rhythm.  Respiratory: Effort normal and breath sounds normal.       Slightly diminished BS left base  GI: Soft. Bowel sounds are normal. There is no tenderness.  Musculoskeletal: Normal range of motion. He exhibits no edema.  Neurological: He is alert and oriented to person, place, and time.     Assessment/Plan Patient with history of left testicular lymphoma; plan is for CT guided bone marrow biopsy and lumbar puncture.  ALLRED,D KEVIN 11/27/2011, 8:58 AM

## 2011-11-27 NOTE — ED Notes (Signed)
Patient denies pain and is resting comfortably.  

## 2011-11-27 NOTE — ED Notes (Signed)
Patient is resting comfortably. 

## 2011-11-27 NOTE — Progress Notes (Signed)
Pt begin c/o headache at 1545 and states it" feels like one of my migraines,this bed is moving so much that it is really aggravating it". Offered to call radiology to report this but pt insists it feels "just like one of my migraines" and wanting to go home. Up at bedside and no changes in headache . Pt dressed and was taken to car via w/c with instructions to call radiology if headache should get worse or not relieved with home medications and pt and wife both agreed

## 2011-11-27 NOTE — Progress Notes (Signed)
Up to bathroom and transported to lumbar puncture.

## 2011-12-01 ENCOUNTER — Encounter (HOSPITAL_BASED_OUTPATIENT_CLINIC_OR_DEPARTMENT_OTHER): Payer: Self-pay | Admitting: *Deleted

## 2011-12-01 NOTE — Progress Notes (Signed)
NPO AFTER MN AND NO CHEW TOBACCO ARRIVES AT [phone]. CURRENT EKG 10-31-11 AND CBC  11-27-11 IN EPIC.

## 2011-12-02 ENCOUNTER — Encounter (HOSPITAL_BASED_OUTPATIENT_CLINIC_OR_DEPARTMENT_OTHER): Payer: Self-pay | Admitting: Anesthesiology

## 2011-12-02 ENCOUNTER — Ambulatory Visit (HOSPITAL_BASED_OUTPATIENT_CLINIC_OR_DEPARTMENT_OTHER)
Admission: RE | Admit: 2011-12-02 | Discharge: 2011-12-02 | Disposition: A | Discharge status: Home / Self Care | Payer: Managed Care, Other (non HMO) | Source: Ambulatory Visit | Attending: Urology | Admitting: Urology

## 2011-12-02 ENCOUNTER — Other Ambulatory Visit: Payer: Self-pay | Admitting: Urology

## 2011-12-02 ENCOUNTER — Encounter (HOSPITAL_BASED_OUTPATIENT_CLINIC_OR_DEPARTMENT_OTHER): Payer: Self-pay | Admitting: *Deleted

## 2011-12-02 ENCOUNTER — Ambulatory Visit (HOSPITAL_BASED_OUTPATIENT_CLINIC_OR_DEPARTMENT_OTHER): Payer: Managed Care, Other (non HMO) | Admitting: Anesthesiology

## 2011-12-02 ENCOUNTER — Encounter (HOSPITAL_BASED_OUTPATIENT_CLINIC_OR_DEPARTMENT_OTHER)
Admission: RE | Disposition: A | Discharge status: Home / Self Care | Payer: Self-pay | Source: Ambulatory Visit | Attending: Urology

## 2011-12-02 DIAGNOSIS — Z7982 Long term (current) use of aspirin: Secondary | ICD-10-CM | POA: Insufficient documentation

## 2011-12-02 DIAGNOSIS — D4959 Neoplasm of unspecified behavior of other genitourinary organ: Secondary | ICD-10-CM | POA: Insufficient documentation

## 2011-12-02 DIAGNOSIS — C8589 Other specified types of non-Hodgkin lymphoma, extranodal and solid organ sites: Secondary | ICD-10-CM | POA: Insufficient documentation

## 2011-12-02 HISTORY — PX: ORCHIECTOMY: SHX2116

## 2011-12-02 SURGERY — ORCHIECTOMY
Anesthesia: General | Site: Scrotum | Laterality: Left | Wound class: Clean Contaminated

## 2011-12-02 MED ORDER — PROPOFOL 10 MG/ML IV EMUL
INTRAVENOUS | Status: DC | PRN
  Administered 2011-12-02: 200 mg via INTRAVENOUS

## 2011-12-02 MED ORDER — CEPHALEXIN 500 MG PO CAPS: 500.0000 mg | ORAL_CAPSULE | Freq: Three times a day (TID) | ORAL | Status: AC

## 2011-12-02 MED ORDER — LACTATED RINGERS IV SOLN
INTRAVENOUS | Status: DC
  Administered 2011-12-02: 100 mL/h via INTRAVENOUS
  Administered 2011-12-02: 10:00:00 via INTRAVENOUS

## 2011-12-02 MED ORDER — CEFAZOLIN SODIUM-DEXTROSE 2-3 GM-% IV SOLR
2.0000 g | INTRAVENOUS | Status: AC
Start: 2011-12-02 — End: 2011-12-02
  Administered 2011-12-02: 2 g via INTRAVENOUS

## 2011-12-02 MED ORDER — OXYCODONE-ACETAMINOPHEN 5-325 MG PO TABS
1.0000 | ORAL_TABLET | ORAL | Status: DC | PRN
  Administered 2011-12-02: 1 via ORAL

## 2011-12-02 MED ORDER — DEXAMETHASONE SODIUM PHOSPHATE 4 MG/ML IJ SOLN
INTRAMUSCULAR | Status: DC | PRN
  Administered 2011-12-02: 10 mg via INTRAVENOUS

## 2011-12-02 MED ORDER — PROMETHAZINE HCL 25 MG/ML IJ SOLN: 6.2500 mg | INTRAMUSCULAR | Status: DC | PRN

## 2011-12-02 MED ORDER — OXYCODONE-ACETAMINOPHEN 5-325 MG PO TABS: 1.0000 | ORAL_TABLET | ORAL | Status: AC | PRN

## 2011-12-02 MED ORDER — BUPIVACAINE HCL 0.25 % IJ SOLN
INTRAMUSCULAR | Status: DC | PRN
  Administered 2011-12-02: 10 mL

## 2011-12-02 MED ORDER — MIDAZOLAM HCL 5 MG/5ML IJ SOLN
INTRAMUSCULAR | Status: DC | PRN
  Administered 2011-12-02: 2 mg via INTRAVENOUS

## 2011-12-02 MED ORDER — DOCUSATE SODIUM 100 MG PO CAPS: 100.0000 mg | ORAL_CAPSULE | Freq: Two times a day (BID) | ORAL | Status: AC

## 2011-12-02 MED ORDER — ONDANSETRON HCL 4 MG/2ML IJ SOLN
INTRAMUSCULAR | Status: DC | PRN
  Administered 2011-12-02: 4 mg via INTRAVENOUS

## 2011-12-02 MED ORDER — FENTANYL CITRATE 0.05 MG/ML IJ SOLN
INTRAMUSCULAR | Status: DC | PRN
  Administered 2011-12-02: 25 ug via INTRAVENOUS
  Administered 2011-12-02 (×2): 50 ug via INTRAVENOUS
  Administered 2011-12-02 (×3): 25 ug via INTRAVENOUS

## 2011-12-02 MED ORDER — IBUPROFEN 200 MG PO TABS: 200.0000 mg | ORAL_TABLET | Freq: Four times a day (QID) | ORAL | Status: DC | PRN

## 2011-12-02 MED ORDER — ACETAMINOPHEN 10 MG/ML IV SOLN
INTRAVENOUS | Status: DC | PRN
  Administered 2011-12-02: 1000 mg via INTRAVENOUS

## 2011-12-02 MED ORDER — FENTANYL CITRATE 0.05 MG/ML IJ SOLN
25.0000 ug | INTRAMUSCULAR | Status: DC | PRN
  Administered 2011-12-02 (×2): 50 ug via INTRAVENOUS

## 2011-12-02 MED ORDER — ASPIRIN 81 MG PO TABS: 81.0000 mg | ORAL_TABLET | Freq: Every day | ORAL | Status: AC

## 2011-12-02 MED ORDER — CEFAZOLIN SODIUM 1-5 GM-% IV SOLN: 1.0000 g | INTRAVENOUS | Status: DC

## 2011-12-02 MED ORDER — LIDOCAINE HCL (CARDIAC) 20 MG/ML IV SOLN
INTRAVENOUS | Status: DC | PRN
  Administered 2011-12-02: 100 mg via INTRAVENOUS

## 2011-12-02 SURGICAL SUPPLY — 57 items
APL SKNCLS STERI-STRIP NONHPOA (GAUZE/BANDAGES/DRESSINGS)
APPLICATOR COTTON TIP 6IN STRL (MISCELLANEOUS) IMPLANT
BANDAGE GAUZE ELAST BULKY 4 IN (GAUZE/BANDAGES/DRESSINGS) ×2 IMPLANT
BENZOIN TINCTURE PRP APPL 2/3 (GAUZE/BANDAGES/DRESSINGS) IMPLANT
BLADE HEX COATED 2.75 (ELECTRODE) ×2 IMPLANT
BLADE SURG 15 STRL LF DISP TIS (BLADE) ×1 IMPLANT
BLADE SURG 15 STRL SS (BLADE) ×2
BLADE SURG ROTATE 9660 (MISCELLANEOUS) ×2 IMPLANT
CLOTH BEACON ORANGE TIMEOUT ST (SAFETY) ×2 IMPLANT
COVER MAYO STAND STRL (DRAPES) ×2 IMPLANT
COVER TABLE BACK 60X90 (DRAPES) ×2 IMPLANT
DRAIN PENROSE 18X1/2 LTX STRL (DRAIN) IMPLANT
DRAIN PENROSE 18X1/4 LTX STRL (WOUND CARE) ×1 IMPLANT
DRAPE PED LAPAROTOMY (DRAPES) ×2 IMPLANT
DRESSING TELFA 8X3 (GAUZE/BANDAGES/DRESSINGS) ×1 IMPLANT
DRSG TEGADERM 4X4.75 (GAUZE/BANDAGES/DRESSINGS) ×1 IMPLANT
ELECT REM PT RETURN 9FT ADLT (ELECTROSURGICAL) ×2
ELECTRODE REM PT RTRN 9FT ADLT (ELECTROSURGICAL) ×1 IMPLANT
GLOVE BIO SURGEON STRL SZ 6.5 (GLOVE) ×1 IMPLANT
GLOVE BIO SURGEON STRL SZ7.5 (GLOVE) ×2 IMPLANT
GLOVE ECLIPSE 6.0 STRL STRAW (GLOVE) ×1 IMPLANT
GOWN PREVENTION PLUS LG XLONG (DISPOSABLE) ×2 IMPLANT
GOWN STRL REIN XL XLG (GOWN DISPOSABLE) ×2 IMPLANT
IV NS IRRIG 3000ML ARTHROMATIC (IV SOLUTION) IMPLANT
NEEDLE HYPO 22GX1.5 SAFETY (NEEDLE) IMPLANT
NS IRRIG 500ML POUR BTL (IV SOLUTION) ×1 IMPLANT
PACK BASIN DAY SURGERY FS (CUSTOM PROCEDURE TRAY) ×2 IMPLANT
PENCIL BUTTON HOLSTER BLD 10FT (ELECTRODE) ×2 IMPLANT
SET COLLECT BLD 21X3/4 12 (NEEDLE) ×2 IMPLANT
SPONGE INTESTINAL PEANUT (DISPOSABLE) ×1 IMPLANT
SPONGE LAP 4X18 X RAY DECT (DISPOSABLE) ×1 IMPLANT
STAPLER VISISTAT (STAPLE) ×1 IMPLANT
STRIP CLOSURE SKIN 1/2X4 (GAUZE/BANDAGES/DRESSINGS) IMPLANT
STRIP CLOSURE SKIN 1/4X4 (GAUZE/BANDAGES/DRESSINGS) IMPLANT
SUPPORT SCROTAL LG STRP (MISCELLANEOUS) IMPLANT
SUT CHROMIC 3 0 SH 27 (SUTURE) ×4 IMPLANT
SUT MNCRL AB 4-0 PS2 18 (SUTURE) IMPLANT
SUT SILK 2 0 SH (SUTURE) IMPLANT
SUT SILK 2 0 TIES 17X18 (SUTURE)
SUT SILK 2-0 18XBRD TIE BLK (SUTURE) IMPLANT
SUT SILK 3 0 SH 30 (SUTURE) IMPLANT
SUT VIC AB 0 SH 27 (SUTURE) IMPLANT
SUT VIC AB 2-0 CT2 27 (SUTURE) IMPLANT
SUT VIC AB 2-0 SH 27 (SUTURE) ×2
SUT VIC AB 2-0 SH 27X BRD (SUTURE) IMPLANT
SUT VIC AB 2-0 UR5 27 (SUTURE) IMPLANT
SUT VIC AB 3-0 SH 27 (SUTURE)
SUT VIC AB 3-0 SH 27X BRD (SUTURE) IMPLANT
SUT VICRYL 0 TIES 12 18 (SUTURE) IMPLANT
SYR 30ML LL (SYRINGE) IMPLANT
SYR BULB IRRIGATION 50ML (SYRINGE) ×1 IMPLANT
SYR CONTROL 10ML LL (SYRINGE) IMPLANT
TOWEL OR 17X24 6PK STRL BLUE (TOWEL DISPOSABLE) ×4 IMPLANT
TRAY DSU PREP LF (CUSTOM PROCEDURE TRAY) ×2 IMPLANT
TUBE CONNECTING 12X1/4 (SUCTIONS) ×2 IMPLANT
WATER STERILE IRR 500ML POUR (IV SOLUTION) ×2 IMPLANT
YANKAUER SUCT BULB TIP NO VENT (SUCTIONS) ×2 IMPLANT

## 2011-12-02 NOTE — Brief Op Note (Signed)
12/02/2011  10:39 AM  PATIENT:  [name]  66 y.o. male  PRE-OPERATIVE DIAGNOSIS: left testicular b-cell  lymphoma  POST-OPERATIVE DIAGNOSIS:  same  PROCEDURE:  Procedure(s): left inguinal ORCHIECTOMY  SURGEON:  Surgeon(s): Antony Haste, MD  ANESTHESIA:   general  EBL:  Total I/O In: 1000 [I.V.:1000] Out: -   BLOOD ADMINISTERED:none  DRAINS: none   LOCAL MEDICATIONS USED:  MARCAINE 10 CC  SPECIMEN:  Left testicle and cord    DISPOSITION OF SPECIMEN:  PATHOLOGY  COUNTS CORRECT:  YES  DICTATION: 161096  PLAN OF CARE: Discharge to home after PACU  PATIENT DISPOSITION:  PACU - hemodynamically stable.   Delay start of Pharmacological VTE agent (>24hrs) due to surgical blood loss or risk of bleeding:  {YES/NO/NOT APPLICABLE:20182

## 2011-12-02 NOTE — Transfer of Care (Addendum)
Immediate Anesthesia Transfer of Care Note  Patient: [name]  Procedure(s) Performed:  ORCHIECTOMY - left inguinal orchiectomy   Patient Location: PACU  Anesthesia Type: General  Level of Consciousness: sedate, oral airway in place  Airway & Oxygen Therapy: Patient Spontanous Breathing and Patient connected to face mask oxygen  Post-op Assessment: Report given to PACU RN and Post -op Vital signs reviewed and stable  Post vital signs: Reviewed and stable  Complications: No apparent anesthesia complications   Post vital signs:  Filed Vitals:   12/02/11 1053  BP:   Pulse: 75  Temp:   Resp: 6

## 2011-12-02 NOTE — Anesthesia Procedure Notes (Signed)
Procedure Name: LMA Insertion Date/Time: 12/02/2011 9:31 AM Performed by: Huel Coventry Pre-anesthesia Checklist: Patient identified, Emergency Drugs available, Suction available and Patient being monitored Patient Re-evaluated:Patient Re-evaluated prior to inductionOxygen Delivery Method: Circle System Utilized Preoxygenation: Pre-oxygenation with 100% oxygen Intubation Type: IV induction Ventilation: Mask ventilation without difficulty LMA: LMA inserted LMA Size: 4.0 Number of attempts: 1 Airway Equipment and Method: bite block Placement Confirmation: positive ETCO2 Tube secured with: Tape Dental Injury: Teeth and Oropharynx as per pre-operative assessment

## 2011-12-02 NOTE — H&P (Signed)
H&P    History of Present Illness: 66 yo s/p left open inguinal testicular biopsy Oct 31, 2011. Frozen sections equivocal (inflammation vs. Malignancy). Pt wanted to keep testicle if any possibility of it being benign. Path returned large b-cell lymphoma and patient was seen by Dr. Myna Hidalgo. Dr. Myna Hidalgo, Oncology, called me and was adamant the testicle needed to be removed in it's entirety. He was not comfortable with radiation or chemo for the testicle. I discussed this with the patient and he elected for left inguinal orchiectomy as well.  The patient is well today. No pain, no F/C, N/V , CP or SOB.  Past Medical History  Diagnosis Date  . History of kidney stones   . Testicular neoplasm left  . Pinched cervical nerve root     occasional symptoms  . 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 Cystoscopy/retrograde/ureteroscopy/stone extraction with basket 2004  . Testicle biopsy 10/31/2011    Procedure: BIOPSY TESTICULAR;  Surgeon: Antony Haste, MD;  Location: [facility];  Service: Urology;  Laterality: Left;  . Bone marrow aspirate and biopsy wiith lumbar puncture 11-27-2011    CT GUIDED    Home Medications:  Prescriptions prior to admission  Medication Sig Dispense Refill  . acetaminophen (TYLENOL) 500 MG tablet Take 500 mg by mouth every 6 (six) hours as needed.      Marland Kitchen aspirin 81 MG tablet Take 1 tablet (81 mg total) by mouth daily.      Marland Kitchen ibuprofen (ADVIL,MOTRIN) 200 MG tablet Take 200 mg by mouth every 6 (six) hours as needed.       Allergies:  Allergies  Allergen Reactions  . Morphine And Related Itching  . Motrin (Ibuprofen) Nausea Only    History reviewed. No pertinent family history. Social History:  reports that he quit smoking about 17 years ago. His smoking use included Cigarettes. He has a 50 pack-year smoking history. His smokeless tobacco use includes Chew. He reports  that he drinks alcohol. He reports that he does not use illicit drugs.  ROS: A complete review of systems was performed.  All systems are negative except for pertinent findings as noted. @ROS @  Physical Exam:  Vital signs in last 24 hours: Temp:  [97 F (36.1 C)] 97 F (36.1 C) (01/15 0843) Pulse Rate:  [61] 61  (01/15 0843) Resp:  [20] 20  (01/15 0843) BP: (127)/(74) 127/74 mmHg (01/15 0843) SpO2:  [96 %] 96 % (01/15 0843) Weight:  [81.647 kg (180 lb)] 81.647 kg (180 lb) (01/14 1338) General:  Alert and oriented, No acute distress HEENT: Normocephalic, atraumatic Neck: No JVD or lymphadenopathy Cardiovascular: Regular rate and rhythm Lungs: Clear bilaterally Abdomen: Soft, nontender, nondistended, no abdominal masses Back: No CVA tenderness Genitourinary:  Normal male phallus, testes are descended bilaterally and non-tender. Right testicle is normal without mass. Left testicle is hard and indurated - mass-like. Scroum is normal in appearance without lesions or masses, perineum is normal on inspection. Extremities: No edema Neurologic: Grossly intact  Laboratory Data:  No results found for this or any previous visit (from the past 24 hour(s)). No results found for this or any previous visit (from the past 240 hour(s)). Creatinine: No results found for this basename: CREATININE:7 in the last 168 hours  Impression/Assessment:  B-cell Lymphoma Left testicle  Plan:  Left inguinal orchiectomy. I discussed with the patient the nature, potential benefits, risks and alternatives to  left inguinal orchiectomy, including side effects of the proposed treatment, the likelihood of the patient achieving the goals of the procedure, and any potential problems that might occur during the procedure or recuperation. All questions answered. Patient elects to proceed.   Antony Haste 12/02/2011, 9:06 AM

## 2011-12-02 NOTE — Anesthesia Postprocedure Evaluation (Signed)
  Anesthesia Post-op Note  Patient: [name]  Procedure(s) Performed:  ORCHIECTOMY - left inguinal orchiectomy   Patient Location: PACU  Anesthesia Type: General  Level of Consciousness: awake and alert   Airway and Oxygen Therapy: Patient Spontanous Breathing  Post-op Pain: mild  Post-op Assessment: Post-op Vital signs reviewed, Patient's Cardiovascular Status Stable, Respiratory Function Stable, Patent Airway and No signs of Nausea or vomiting  Post-op Vital Signs: stable  Complications: No apparent anesthesia complications

## 2011-12-02 NOTE — Anesthesia Preprocedure Evaluation (Signed)
Anesthesia Evaluation  Patient identified by MRN, date of birth, ID band Patient awake    Reviewed: Allergy & Precautions, H&P , NPO status , Patient's Chart, lab work & pertinent test results  Airway Mallampati: II TM Distance: >3 FB Neck ROM: Full    Dental No notable dental hx.    Pulmonary Current Smoker,  50 pack yr hx clear to auscultation  Pulmonary exam normal       Cardiovascular neg cardio ROS Regular Normal    Neuro/Psych Negative Psych ROS   GI/Hepatic negative GI ROS, Neg liver ROS,   Endo/Other  Negative Endocrine ROS  Renal/GU negative Renal ROS  Genitourinary negative   Musculoskeletal negative musculoskeletal ROS (+)   Abdominal   Peds negative pediatric ROS (+)  Hematology negative hematology ROS (+)   Anesthesia Other Findings   Reproductive/Obstetrics negative OB ROS                           Anesthesia Physical Anesthesia Plan  ASA: III  Anesthesia Plan: General   Post-op Pain Management:    Induction: Intravenous  Airway Management Planned: LMA  Additional Equipment:   Intra-op Plan:   Post-operative Plan:   Informed Consent: I have reviewed the patients History and Physical, chart, labs and discussed the procedure including the risks, benefits and alternatives for the proposed anesthesia with the patient or authorized representative who has indicated his/her understanding and acceptance.   Dental advisory given  Plan Discussed with: CRNA  Anesthesia Plan Comments:         Anesthesia Quick Evaluation

## 2011-12-03 ENCOUNTER — Encounter (HOSPITAL_BASED_OUTPATIENT_CLINIC_OR_DEPARTMENT_OTHER): Payer: Self-pay | Admitting: Urology

## 2011-12-03 NOTE — Op Note (Signed)
[name], AGE NO.:  [id number]  MEDICAL RECORD NO.:  [id number]  LOCATION:                               FACILITY:  [facility]  PHYSICIAN:  Jerilee Field, MD   DATE OF BIRTH:  09/07/1946  DATE OF PROCEDURE: DATE OF DISCHARGE:                              OPERATIVE REPORT   PREOPERATIVE DIAGNOSIS:  Left testicular B-cell lymphoma.  POSTOPERATIVE DIAGNOSIS:  Left testicular B-cell lymphoma.  PROCEDURE:  Left inguinal orchiectomy.  SURGEON:  Jerilee Field, MD  TYPE OF ANESTHESIA:  General with a local block.  INDICATION FOR PROCEDURE:  [name] is a 66 year old male who underwent an open testicular biopsy, December 2012, the path on the frozen section was equivocal between inflammation versus malignancy.  The patient was adamant, he wanted to keep the testicle if any chance of benign disease. Therefore, the testicle was replaced.  Final path came back B- cell lymphoma.  The patient has seen Oncology and Oncology strongly recommended the testicle be removed in its entirety due to concern of inadequate treatment of the testicular component with chemo or radiation.  The oncologist and myself discussed this with the patient. We discussed left inguinal orchiectomy.  All questions were answered and the patient elected to proceed.  We discussed the nature, risks, benefits, and alternatives of the surgery including chemo or radiation alone, but again the patient has accepted his diagnosis and is ready now for left orchiectomy.  FINDINGS:  On exam under anesthesia, the patient had a normal penis without lesions or masses.  The right testicle was palpably normal.  The left testicle was enlarged and hard and indurated throughout.  On rectal exam, the prostate was palpably normal without hard area or nodule. There was no palpable inguinal adenopathy.  During the orchiectomy as expected, the testicle and cord were quite scarred at the surrounding scrotum and  tissues.  There was also quite a bit of scarring of the cord to the surrounding peri-inguinal fat.  This was all dissected from the cord.  The cord was suture ligated x2 on the vas and x2 on the vessels with 2-0 Vicryl suture.  Given that the procedure was for a lymphoma, the external oblique aponeurosis was not taken.  The cord was taken at the external ring.  DESCRIPTION OF PROCEDURE:  After consent was obtained, the patient brought to the operating room, a time-out was performed to confirm the patient and procedure.  He was examined under anesthesia.  The left inguinal area was shaved with clippers, prepped along with the external genitalia and he was draped in the usual sterile fashion.  The prior left inguinal incision was opened with a 15 blade and Bovie cautery down to Scarpa and Camper fascia.  Can Camper fascia was quite stuck to the cord and it took some dissecting to locate the cord which was then surrounded with a right angle and a Penrose drain and a tourniquet applied.  The testicle was then elevated into the wound.  It was again quite adherent to the gubernaculum which was dissected with the Bovie cautery.  Adequate hemostasis was ensured.  The scrotal skin was not entered.  Testicle was then  released to the cord, again the cord was quite stuck to Camper fascia as well as some large veins running outside the cremasteric muscle.  All this was carefully dissected and tied off and adequate hemostasis was ensured.  Carefully dissecting the cord back to the external ring.  Once the cord was at the external ring, hemostat was used to separate the vas deferens from the rest of the cord.  The vas deferens was then secured with hemostat and transected with the Bovie cautery.  The patient's side of the vas deferens was then suture ligated with a 2-0 Vicryl x2.  Adequate hemostasis was ensured.  This was allowed to fall back toward the external ring.  The cord was then suture ligated  x2, transected with the Bovie cautery.  Adequate hemostasis was ensured on the vessels as they retracted back toward the external ring.  The wound was copiously irrigated.  The scrotal skin, any varicosities, Camper fascia was carefully inspected to ensure adequate hemostasis as well as the stump of the cord had excellent hemostasis.  After period of observation, the fascia was reapproximated with an interrupted 2-0 Vicryl suture.  The skin was then irrigated and closed with staples.  He was cleaned up and a sterile dressing applied as well as fluffs and a jockstrap.  He was then awakened and taken to recovery room in stable condition.  COMPLICATIONS:  None.  ESTIMATED BLOOD LOSS:  Minimal.  SPECIMENS:  Left testicle and cord to Pathology.  PATIENT DISPOSITION:  Stable to PACU.          ______________________________ Jerilee Field, MD     ME/MEDQ  D:  12/02/2011  T:  12/02/2011  Job:  [id number]

## 2011-12-05 ENCOUNTER — Ambulatory Visit (HOSPITAL_BASED_OUTPATIENT_CLINIC_OR_DEPARTMENT_OTHER): Payer: Managed Care, Other (non HMO) | Admitting: Hematology & Oncology

## 2011-12-05 DIAGNOSIS — C8590 Non-Hodgkin lymphoma, unspecified, unspecified site: Secondary | ICD-10-CM

## 2011-12-05 DIAGNOSIS — C8595 Non-Hodgkin lymphoma, unspecified, lymph nodes of inguinal region and lower limb: Secondary | ICD-10-CM

## 2011-12-05 NOTE — Progress Notes (Signed)
This office note has been dictated.

## 2011-12-06 NOTE — Progress Notes (Signed)
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  CURRENT THERAPY:  Observation.  INTERVAL HISTORY:  [name] comes in for followup at my request.  We finally have accomplished all of our workup on him.  Nothing so far with his workup has shown me anything that looks worrisome.  He did undergo his orchiectomy by Dr. Mena Goes.  This was done on the 15th.  I spoke to Dr. Laureen Ochs of pathology.  He is still working on the final results but he says this is consistent with the biopsy, that being diffuse large cell lymphoma.  He is running the specimen for BCL2 and MYC which do appear to show a higher rate of aggression with diffuse lymphoma.  He did have his bone marrow biopsy done.  This I think was done on the 10th.  The pathology report ([id number]) was negative for lymphoma involvement.  He did have his spinal tap done.  This was done on the 10th also.  The pathology report ([id number]) was negative for lymphoma.  He also had an echocardiogram done.  This showed his left ventricular ejection fraction to be 55%-60%.  Again, he had a PET scan done.  The PET scan did show involvement of the retroperitoneal lymph nodes.  He also had involvement of right medial clavicle.  [name] denies any type of trauma to this area.  As such, [name] is now ready for his chemotherapy.  I believe that we need to approach this lymphoma with a "three phase approach".  The first phase will be systemic chemotherapy.  I believe 6 cycles of R-CHOP would be standard for him.  We would then repeat his PET scan after the second cycle.  I also would then recommend radiation therapy to the contralateral testicle.  Studies have shown that this does decrease the risk of relapse in the contralateral testicle.  For the third phase, I would then recommend intrathecal chemotherapy. Testicular lymphoma is well known to recur into the spinal fluid.  I believe [name] would benefit from  this approach.  [name] is having some discomfort from the orchiectomy.  He does not like taking pain medications.  There has been no fever.  He has had no cough.  He has had no nausea, vomiting.  PHYSICAL EXAMINATION:  General:  This is a well-developed, well- nourished, white gentleman in no obvious distress.  Vital signs:  96.9, pulse 73, respiratory rate 18, blood pressure 145/94.  Neck:  Shows no adenopathy in the neck.  HEENT:  He has no oral lesions.  Lungs:  His lungs are clear bilaterally.  Cardiac:  Regular rate and rhythm with no murmurs, rubs or bruits.  Abdomen:  Soft with good bowel sounds.  There is no palpable abdominal mass.  He does have the healing orchiectomy scar in the left inguinal region.  Extremities:  Show no clubbing, cyanosis or edema.  Skin:  Exam shows no rashes.  Neurological:  No focal neurological deficits.  IMPRESSION:  [name] is a 66 year old gentleman with testicular lymphoma.  He did undergo orchiectomy.  We will go ahead and start chemotherapy next week.  I went over all the side effects of chemotherapy with he and his wife.  I explained the nature of chemotherapy.  I explained that with an aggressive approach we have a significant rate of cure.  We will go ahead and get [name] set up with his treatments.  We will start next week on 01/24.  He wants to be treated on Fridays.  We are going to accommodate him by treating him on Fridays after his first cycle is done.  I spent a good hour or so with he and his wife.  I answered all their questions.  I gave [name] information sheets about each chemotherapy medication that he will be receiving.    ______________________________ [name], M.D. PRE/MEDQ  D:  12/05/2011  T:  12/06/2011  Job:  [id number]

## 2011-12-12 ENCOUNTER — Ambulatory Visit (HOSPITAL_BASED_OUTPATIENT_CLINIC_OR_DEPARTMENT_OTHER): Payer: Managed Care, Other (non HMO)

## 2011-12-12 ENCOUNTER — Ambulatory Visit: Payer: Managed Care, Other (non HMO)

## 2011-12-12 ENCOUNTER — Other Ambulatory Visit: Payer: Managed Care, Other (non HMO)

## 2011-12-12 VITALS — BP 133/76 | HR 97 | Temp 97.1°F

## 2011-12-12 DIAGNOSIS — Z5111 Encounter for antineoplastic chemotherapy: Secondary | ICD-10-CM

## 2011-12-12 DIAGNOSIS — R110 Nausea: Secondary | ICD-10-CM

## 2011-12-12 DIAGNOSIS — C8330 Diffuse large B-cell lymphoma, unspecified site: Secondary | ICD-10-CM

## 2011-12-12 DIAGNOSIS — C8595 Non-Hodgkin lymphoma, unspecified, lymph nodes of inguinal region and lower limb: Secondary | ICD-10-CM

## 2011-12-12 DIAGNOSIS — Z5112 Encounter for antineoplastic immunotherapy: Secondary | ICD-10-CM

## 2011-12-12 MED ORDER — ACETAMINOPHEN 325 MG PO TABS
650.0000 mg | ORAL_TABLET | Freq: Once | ORAL | Status: AC
  Administered 2011-12-12: 650 mg via ORAL

## 2011-12-12 MED ORDER — DIPHENHYDRAMINE HCL 25 MG PO CAPS
50.0000 mg | ORAL_CAPSULE | Freq: Once | ORAL | Status: AC
  Administered 2011-12-12: 50 mg via ORAL

## 2011-12-12 MED ORDER — SODIUM CHLORIDE 0.9 % IV SOLN
Freq: Once | INTRAVENOUS | Status: AC
  Administered 2011-12-12: 10:00:00 via INTRAVENOUS

## 2011-12-12 MED ORDER — SODIUM CHLORIDE 0.9 % IV SOLN
375.0000 mg/m2 | Freq: Once | INTRAVENOUS | Status: AC
  Administered 2011-12-12: 800 mg via INTRAVENOUS
  Filled 2011-12-12: qty 80

## 2011-12-12 MED ORDER — LORAZEPAM 0.5 MG PO TABS: 0.5000 mg | ORAL_TABLET | Freq: Three times a day (TID) | ORAL | Status: AC

## 2011-12-12 MED ORDER — LORAZEPAM 0.5 MG PO TABS
0.5000 mg | ORAL_TABLET | Freq: Once | ORAL | Status: AC
  Administered 2011-12-12: 0.5 mg via ORAL

## 2011-12-12 MED ORDER — LORAZEPAM 0.5 MG PO TABS: 0.5000 mg | ORAL_TABLET | Freq: Four times a day (QID) | ORAL | Status: DC | PRN

## 2011-12-12 MED ORDER — DEXAMETHASONE SODIUM PHOSPHATE 4 MG/ML IJ SOLN
20.0000 mg | Freq: Once | INTRAMUSCULAR | Status: AC
  Administered 2011-12-12: 20 mg via INTRAVENOUS

## 2011-12-12 MED ORDER — PROCHLORPERAZINE MALEATE 10 MG PO TABS: 10.0000 mg | ORAL_TABLET | Freq: Four times a day (QID) | ORAL | Status: DC | PRN

## 2011-12-12 MED ORDER — ONDANSETRON 16 MG/50ML IVPB (CHCC)
16.0000 mg | Freq: Once | INTRAVENOUS | Status: AC
  Administered 2011-12-12: 16 mg via INTRAVENOUS
  Filled 2011-12-12: qty 16

## 2011-12-12 MED ORDER — VINCRISTINE SULFATE CHEMO INJECTION 1 MG/ML
2.0000 mg | Freq: Once | INTRAVENOUS | Status: AC
  Administered 2011-12-12: 2 mg via INTRAVENOUS
  Filled 2011-12-12: qty 2

## 2011-12-12 MED ORDER — DOXORUBICIN HCL CHEMO IV INJECTION 2 MG/ML
50.0000 mg/m2 | Freq: Once | INTRAVENOUS | Status: AC
  Administered 2011-12-12: 102 mg via INTRAVENOUS
  Filled 2011-12-12: qty 51

## 2011-12-12 MED ORDER — PREDNISONE 20 MG PO TABS: 40.0000 mg/m2 | ORAL_TABLET | Freq: Every day | ORAL | Status: AC

## 2011-12-12 MED ORDER — ONDANSETRON HCL 8 MG PO TABS: ORAL_TABLET | ORAL | Status: DC

## 2011-12-12 MED ORDER — SODIUM CHLORIDE 0.9 % IV SOLN
750.0000 mg/m2 | Freq: Once | INTRAVENOUS | Status: AC
  Administered 2011-12-12: 1520 mg via INTRAVENOUS
  Filled 2011-12-12: qty 76

## 2011-12-12 NOTE — Patient Instructions (Signed)
[facility] Discharge Instructions for Patients Receiving Chemotherapy  Today you received the following chemotherapy agents Cytoxan, Adriamycin, Vincristine  To help prevent nausea and vomiting after your treatment, we encourage you to take your nausea medication   If you develop nausea and vomiting that is not controlled by your nausea medication, call the clinic. If it is after clinic hours your family physician or the after hours number for the clinic or go to the Emergency Department.   BELOW ARE SYMPTOMS THAT SHOULD BE REPORTED IMMEDIATELY:  *FEVER GREATER THAN 101.0 F  *CHILLS WITH OR WITHOUT FEVER  NAUSEA AND VOMITING THAT IS NOT CONTROLLED WITH YOUR NAUSEA MEDICATION  *UNUSUAL SHORTNESS OF BREATH  *UNUSUAL BRUISING OR BLEEDING  TENDERNESS IN MOUTH AND THROAT WITH OR WITHOUT PRESENCE OF ULCERS  *URINARY PROBLEMS  *BOWEL PROBLEMS  UNUSUAL RASH Items with * indicate a potential emergency and should be followed up as soon as possible.  One of the nurses will contact you 24 hours after your treatment. Please let the nurse know about any problems that you may have experienced. Feel free to call the clinic you have any questions or concerns. The clinic phone number is [phone].   I have been informed and understand all the instructions given to me. I know to contact the clinic, my physician, or go to the Emergency Department if any problems should occur. I do not have any questions at this time, but understand that I may call the clinic during office hours or the Patient Navigator at [phone] should I have any questions or need assistance in obtaining follow up care.    __________________________________________  _____________  __________ Signature of Patient or Authorized Representative            Date                   Time    __________________________________________ Nurse's Signature

## 2011-12-12 NOTE — Progress Notes (Signed)
Patients post chemo medications sent through computer to [facility] pharmacy on [address].  In light of the bad weather and roads patient asked me to call RX [facility].  [facility] called to cancel order and called down to [facility] same Rx for patient.

## 2011-12-12 NOTE — Progress Notes (Signed)
Patient experienced flushing upon initial Adriamycin infusion.  Patient states that heart rate was rapidly climbing and felt short of breath.  No pain at iv site.  Excellent blood return noted.  Infusion stopped.  Dr. Tanja Port informed.  Patient given .5 mg SL.  VSS.  Patient quickly felt better. Episode only lasted about 5 minutes.

## 2011-12-18 ENCOUNTER — Other Ambulatory Visit: Payer: Self-pay | Admitting: *Deleted

## 2011-12-18 DIAGNOSIS — C8330 Diffuse large B-cell lymphoma, unspecified site: Secondary | ICD-10-CM

## 2011-12-18 MED ORDER — MAGIC MOUTHWASH W/LIDOCAINE: 5.0000 mL | Freq: Four times a day (QID) | ORAL | Status: DC | PRN

## 2011-12-18 NOTE — Telephone Encounter (Signed)
Pt called to report that he is having mouth sores. It started yesterday. He also is having "a change with my taste buds". Denies having a fever or sore throat. Reviewed with Dr Myna Hidalgo. To start Magic Mouthwash (eprescribed to [facility]) and baking soda/salt water rinses prn. Instructed him not to use alcohol based mouth washes and to use soft bristled tooth brush. He verbalized understanding of the instructions and will call if his sx worsen.

## 2012-01-02 ENCOUNTER — Ambulatory Visit (HOSPITAL_BASED_OUTPATIENT_CLINIC_OR_DEPARTMENT_OTHER): Payer: Managed Care, Other (non HMO) | Admitting: Hematology & Oncology

## 2012-01-02 ENCOUNTER — Ambulatory Visit (HOSPITAL_BASED_OUTPATIENT_CLINIC_OR_DEPARTMENT_OTHER): Payer: Managed Care, Other (non HMO)

## 2012-01-02 ENCOUNTER — Other Ambulatory Visit (HOSPITAL_BASED_OUTPATIENT_CLINIC_OR_DEPARTMENT_OTHER): Payer: Managed Care, Other (non HMO) | Admitting: Lab

## 2012-01-02 VITALS — BP 140/87 | HR 95 | Temp 96.5°F | Ht 70.0 in | Wt 184.0 lb

## 2012-01-02 DIAGNOSIS — Z5111 Encounter for antineoplastic chemotherapy: Secondary | ICD-10-CM

## 2012-01-02 DIAGNOSIS — C8330 Diffuse large B-cell lymphoma, unspecified site: Secondary | ICD-10-CM

## 2012-01-02 DIAGNOSIS — C8595 Non-Hodgkin lymphoma, unspecified, lymph nodes of inguinal region and lower limb: Secondary | ICD-10-CM

## 2012-01-02 DIAGNOSIS — C8590 Non-Hodgkin lymphoma, unspecified, unspecified site: Secondary | ICD-10-CM

## 2012-01-02 LAB — CBC WITH DIFFERENTIAL (CANCER CENTER ONLY)
BASO#: 0.1 10*3/uL (ref 0.0–0.2)
BASO%: 0.8 % (ref 0.0–2.0)
EOS%: 1.5 % (ref 0.0–7.0)
HGB: 14.9 g/dL (ref 13.0–17.1)
LYMPH#: 1.9 10*3/uL (ref 0.9–3.3)
MCHC: 35.3 g/dL (ref 32.0–35.9)
MONO#: 0.9 10*3/uL (ref 0.1–0.9)
NEUT#: 3.2 10*3/uL (ref 1.5–6.5)
WBC: 6.2 10*3/uL (ref 4.0–10.0)

## 2012-01-02 LAB — COMPREHENSIVE METABOLIC PANEL
ALT: 51 U/L (ref 0–53)
AST: 28 U/L (ref 0–37)
Albumin: 3.9 g/dL (ref 3.5–5.2)
BUN: 11 mg/dL (ref 6–23)
CO2: 21 mEq/L (ref 19–32)
Calcium: 9 mg/dL (ref 8.4–10.5)
Chloride: 106 mEq/L (ref 96–112)
Potassium: 4.2 mEq/L (ref 3.5–5.3)

## 2012-01-02 MED ORDER — VINCRISTINE SULFATE CHEMO INJECTION 1 MG/ML
2.0000 mg | Freq: Once | INTRAVENOUS | Status: AC
  Administered 2012-01-02: 2 mg via INTRAVENOUS
  Filled 2012-01-02: qty 2

## 2012-01-02 MED ORDER — DIPHENHYDRAMINE HCL 25 MG PO CAPS
50.0000 mg | ORAL_CAPSULE | Freq: Once | ORAL | Status: AC
  Administered 2012-01-02: 50 mg via ORAL

## 2012-01-02 MED ORDER — SODIUM CHLORIDE 0.9 % IV SOLN: 375.0000 mg/m2 | Freq: Once | INTRAVENOUS | Status: DC

## 2012-01-02 MED ORDER — DEXAMETHASONE SODIUM PHOSPHATE 4 MG/ML IJ SOLN
20.0000 mg | Freq: Once | INTRAMUSCULAR | Status: AC
  Administered 2012-01-02: 20 mg via INTRAVENOUS

## 2012-01-02 MED ORDER — SODIUM CHLORIDE 0.9 % IJ SOLN
10.0000 mL | INTRAMUSCULAR | Status: DC | PRN
  Filled 2012-01-02: qty 10

## 2012-01-02 MED ORDER — SODIUM CHLORIDE 0.9 % IV SOLN
Freq: Once | INTRAVENOUS | Status: AC
  Administered 2012-01-02: 09:00:00 via INTRAVENOUS

## 2012-01-02 MED ORDER — ONDANSETRON 16 MG/50ML IVPB (CHCC)
16.0000 mg | Freq: Once | INTRAVENOUS | Status: AC
  Administered 2012-01-02: 16 mg via INTRAVENOUS
  Filled 2012-01-02: qty 16

## 2012-01-02 MED ORDER — CYCLOPHOSPHAMIDE CHEMO INJECTION 1 GM
750.0000 mg/m2 | Freq: Once | INTRAMUSCULAR | Status: AC
  Administered 2012-01-02: 1520 mg via INTRAVENOUS
  Filled 2012-01-02: qty 76

## 2012-01-02 MED ORDER — DOXORUBICIN HCL CHEMO IV INJECTION 2 MG/ML
50.0000 mg/m2 | Freq: Once | INTRAVENOUS | Status: AC
  Administered 2012-01-02: 102 mg via INTRAVENOUS
  Filled 2012-01-02: qty 51

## 2012-01-02 MED ORDER — ACETAMINOPHEN 325 MG PO TABS
650.0000 mg | ORAL_TABLET | Freq: Once | ORAL | Status: AC
  Administered 2012-01-02: 650 mg via ORAL

## 2012-01-02 MED ORDER — SODIUM CHLORIDE 0.9 % IV SOLN
375.0000 mg/m2 | Freq: Once | INTRAVENOUS | Status: AC
  Administered 2012-01-02: 800 mg via INTRAVENOUS
  Filled 2012-01-02: qty 80

## 2012-01-02 NOTE — Progress Notes (Signed)
CC:   [name], M.D. 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  CURRENT THERAPY:  Status post cycle 1 of R-CHOP.  INTERIM HISTORY:  [name] comes in for followup.  He did okay with his first cycle of chemotherapy.  He did have some mouth sores.  We gave him some Magic Mouthwash.  I told him to gargle with water and baking soda 6 times a day.  He did have some improvement in his achiness.  This may have been from the prednisone that he was taking.  He then had some arthralgias.  This may have been from the Neulasta that he is on.  He had no fever.  He had no bleeding.  He is actually still working some.  He has recovered from his orchiectomy on the left side.  Of note, we did have pathology run additional studies on his lymphoma. He is negative for "double-hit" mutation.  This is a good sign.  PHYSICAL EXAMINATION:  General Appearance:  This is a well-developed, well-nourished, white gentleman in no obvious distress.  Vital Signs: Temperature 96.5.  Pulse 95.  Respiratory rate 16.  Blood pressure 140/87.  Weight is 184.  Head and Neck Exam:  A normocephalic, atraumatic skull.  There are no ocular or oral lesions.  He does have pretty much alopecia.  There is no mucositis.  There is no adenopathy in his neck.  Lungs:  Clear bilaterally.  Cardiac Exam:  Regular rhythm with a normal S1 and S2.  There are no murmurs, rubs, or bruits. Abdominal Exam:  Soft with good bowel sounds.  There is no palpable abdominal mass.  There is no palpable fluid wave.  He has a nicely healing left inguinal orchiectomy scar.  Back Exam:  No tenderness of the spine, ribs, or hips.  Extremities:  No clubbing, cyanosis, or edema.  Skin Exam:  No rashes, ecchymosis, or petechia.  Neurological Exam:  No focal neurological deficits.  LABORATORY STUDIES:  White cell count 6.2, hemoglobin 14.9, hematocrit 42.2, platelet count 270.  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  He did undergo orchiectomy.  He is on "phase 1" of his therapy program.  He is getting systemic chemotherapy.  We will go ahead and treat him today.  His blood counts look great.  I will get him set up with a followup PET scan to see how he is responding to treatment.  I have to believe that he is responding.  We will plan to get him back in 3 weeks' time.    ______________________________ [name], M.D. PRE/MEDQ  D:  01/02/2012  T:  01/02/2012  Job:  [id number]

## 2012-01-03 ENCOUNTER — Telehealth: Payer: Self-pay | Admitting: *Deleted

## 2012-01-03 ENCOUNTER — Ambulatory Visit: Payer: Managed Care, Other (non HMO)

## 2012-01-03 NOTE — Telephone Encounter (Signed)
Called patient since he was late for appt to inquire why he had not arrived for his Day 2 injection of Neulasta. He informed our staff that he was not told of this yesterday and it was not on his calendar. Looking back in patient chart, it does not appear that he received Neulasta with his first treatment. This may be an error in ordering as this injection may be attached to the care plan of treatment. Informed patient that he still has up to 72hours to have the injection, so we will contact Dr Gustavo Lah office early on Monday morning for clarification to determine if patient will need injection. He states the earlier the better, will need to know by 10am if he is to get injection due to his working.

## 2012-01-05 ENCOUNTER — Telehealth: Payer: Self-pay | Admitting: Hematology & Oncology

## 2012-01-05 NOTE — Telephone Encounter (Signed)
Pt aware of 3-4 PET at [facility] and to be NPO 6 hrs prior

## 2012-01-19 ENCOUNTER — Encounter (HOSPITAL_COMMUNITY): Payer: Self-pay

## 2012-01-19 ENCOUNTER — Telehealth: Payer: Self-pay | Admitting: Hematology & Oncology

## 2012-01-19 ENCOUNTER — Encounter (HOSPITAL_COMMUNITY)
Admission: RE | Admit: 2012-01-19 | Discharge: 2012-01-19 | Disposition: A | Discharge status: Home / Self Care | Payer: Managed Care, Other (non HMO) | Source: Ambulatory Visit | Attending: Hematology & Oncology | Admitting: Hematology & Oncology

## 2012-01-19 DIAGNOSIS — C8330 Diffuse large B-cell lymphoma, unspecified site: Secondary | ICD-10-CM

## 2012-01-19 DIAGNOSIS — C8589 Other specified types of non-Hodgkin lymphoma, extranodal and solid organ sites: Secondary | ICD-10-CM | POA: Insufficient documentation

## 2012-01-19 HISTORY — DX: Malignant (primary) neoplasm, unspecified: C80.1

## 2012-01-19 MED ORDER — FLUDEOXYGLUCOSE F - 18 (FDG) INJECTION
18.4000 | Freq: Once | INTRAVENOUS | Status: AC | PRN
  Administered 2012-01-19: 18.4 via INTRAVENOUS

## 2012-01-19 NOTE — Telephone Encounter (Signed)
I left a message that the PET scan did not show any active NHL.   pete

## 2012-01-23 ENCOUNTER — Ambulatory Visit (HOSPITAL_BASED_OUTPATIENT_CLINIC_OR_DEPARTMENT_OTHER): Payer: Managed Care, Other (non HMO)

## 2012-01-23 ENCOUNTER — Other Ambulatory Visit: Payer: Managed Care, Other (non HMO) | Admitting: Lab

## 2012-01-23 VITALS — BP 140/78 | HR 68 | Temp 97.6°F

## 2012-01-23 DIAGNOSIS — C8330 Diffuse large B-cell lymphoma, unspecified site: Secondary | ICD-10-CM

## 2012-01-23 DIAGNOSIS — Z5111 Encounter for antineoplastic chemotherapy: Secondary | ICD-10-CM

## 2012-01-23 DIAGNOSIS — C8589 Other specified types of non-Hodgkin lymphoma, extranodal and solid organ sites: Secondary | ICD-10-CM

## 2012-01-23 DIAGNOSIS — C8595 Non-Hodgkin lymphoma, unspecified, lymph nodes of inguinal region and lower limb: Secondary | ICD-10-CM

## 2012-01-23 LAB — COMPREHENSIVE METABOLIC PANEL
ALT: 49 U/L (ref 0–53)
AST: 27 U/L (ref 0–37)
Albumin: 4 g/dL (ref 3.5–5.2)
Alkaline Phosphatase: 64 U/L (ref 39–117)
BUN: 10 mg/dL (ref 6–23)
Chloride: 106 mEq/L (ref 96–112)
Potassium: 4.2 mEq/L (ref 3.5–5.3)
Sodium: 138 mEq/L (ref 135–145)
Total Protein: 6.4 g/dL (ref 6.0–8.3)

## 2012-01-23 LAB — CBC WITH DIFFERENTIAL (CANCER CENTER ONLY)
BASO%: 2.2 % — ABNORMAL HIGH (ref 0.0–2.0)
EOS%: 1.3 % (ref 0.0–7.0)
LYMPH%: 33.8 % (ref 14.0–48.0)
MCH: 31.1 pg (ref 28.0–33.4)
MCHC: 35.6 g/dL (ref 32.0–35.9)
MCV: 87 fL (ref 82–98)
MONO%: 18.7 % — ABNORMAL HIGH (ref 0.0–13.0)
Platelets: 250 10*3/uL (ref 145–400)
RDW: 13.5 % (ref 11.1–15.7)

## 2012-01-23 LAB — TECHNOLOGIST REVIEW CHCC SATELLITE

## 2012-01-23 MED ORDER — SODIUM CHLORIDE 0.9 % IV SOLN: 375.0000 mg/m2 | Freq: Once | INTRAVENOUS | Status: DC

## 2012-01-23 MED ORDER — ONDANSETRON 16 MG/50ML IVPB (CHCC)
16.0000 mg | Freq: Once | INTRAVENOUS | Status: AC
  Administered 2012-01-23: 16 mg via INTRAVENOUS

## 2012-01-23 MED ORDER — VINCRISTINE SULFATE CHEMO INJECTION 1 MG/ML
2.0000 mg | Freq: Once | INTRAVENOUS | Status: AC
  Administered 2012-01-23: 2 mg via INTRAVENOUS
  Filled 2012-01-23: qty 2

## 2012-01-23 MED ORDER — RITUXIMAB CHEMO INJECTION 10 MG/ML
800.0000 mg | Freq: Once | INTRAVENOUS | Status: AC
  Administered 2012-01-23: 800 mg via INTRAVENOUS
  Filled 2012-01-23: qty 80

## 2012-01-23 MED ORDER — SODIUM CHLORIDE 0.9 % IV SOLN
Freq: Once | INTRAVENOUS | Status: AC
  Administered 2012-01-23: 10:00:00 via INTRAVENOUS

## 2012-01-23 MED ORDER — SODIUM CHLORIDE 0.9 % IV SOLN
750.0000 mg/m2 | Freq: Once | INTRAVENOUS | Status: AC
  Administered 2012-01-23: 1520 mg via INTRAVENOUS
  Filled 2012-01-23: qty 76

## 2012-01-23 MED ORDER — DEXAMETHASONE SODIUM PHOSPHATE 4 MG/ML IJ SOLN
20.0000 mg | Freq: Once | INTRAMUSCULAR | Status: AC
  Administered 2012-01-23: 20 mg via INTRAVENOUS

## 2012-01-23 MED ORDER — ACETAMINOPHEN 325 MG PO TABS
650.0000 mg | ORAL_TABLET | Freq: Once | ORAL | Status: AC
  Administered 2012-01-23: 650 mg via ORAL

## 2012-01-23 MED ORDER — DOXORUBICIN HCL CHEMO IV INJECTION 2 MG/ML
50.0000 mg/m2 | Freq: Once | INTRAVENOUS | Status: AC
  Administered 2012-01-23: 102 mg via INTRAVENOUS
  Filled 2012-01-23: qty 51

## 2012-01-23 MED ORDER — DIPHENHYDRAMINE HCL 25 MG PO CAPS
50.0000 mg | ORAL_CAPSULE | Freq: Once | ORAL | Status: AC
  Administered 2012-01-23: 50 mg via ORAL

## 2012-01-23 NOTE — Patient Instructions (Signed)
[facility] Discharge Instructions for Patients Receiving Chemotherapy  Today you received Chemotherapy.  To help prevent nausea and vomiting after your treatment, we encourage you to take your nausea medication as prescribed. If you develop nausea and vomiting that is not controlled by your nausea medication, call the clinic. If it is after clinic hours your family physician or the after hours number for the clinic or go to the Emergency Department.   BELOW ARE SYMPTOMS THAT SHOULD BE REPORTED IMMEDIATELY:  *FEVER GREATER THAN 100.5 F  *CHILLS WITH OR WITHOUT FEVER  NAUSEA AND VOMITING THAT IS NOT CONTROLLED WITH YOUR NAUSEA MEDICATION  *UNUSUAL SHORTNESS OF BREATH  *UNUSUAL BRUISING OR BLEEDING  TENDERNESS IN MOUTH AND THROAT WITH OR WITHOUT PRESENCE OF ULCERS  *URINARY PROBLEMS  *BOWEL PROBLEMS  UNUSUAL RASH Items with * indicate a potential emergency and should be followed up as soon as possible.   Please let the nurse know about any problems that you may have experienced. Feel free to call the clinic you have any questions or concerns. The clinic phone number is [phone].   I have been informed and understand all the instructions given to me. I know to contact the clinic, my physician, or go to the Emergency Department if any problems should occur. I do not have any questions at this time, but understand that I may call the clinic during office hours   should I have any questions or need assistance in obtaining follow up care.    __________________________________________  _____________  __________ Signature of Patient or Authorized Representative            Date                   Time    __________________________________________ Nurse's Signature

## 2012-01-24 ENCOUNTER — Ambulatory Visit: Payer: Managed Care, Other (non HMO)

## 2012-02-13 ENCOUNTER — Ambulatory Visit (HOSPITAL_BASED_OUTPATIENT_CLINIC_OR_DEPARTMENT_OTHER): Payer: Managed Care, Other (non HMO)

## 2012-02-13 ENCOUNTER — Other Ambulatory Visit (HOSPITAL_BASED_OUTPATIENT_CLINIC_OR_DEPARTMENT_OTHER): Payer: Managed Care, Other (non HMO) | Admitting: Lab

## 2012-02-13 ENCOUNTER — Ambulatory Visit: Payer: Managed Care, Other (non HMO)

## 2012-02-13 ENCOUNTER — Other Ambulatory Visit: Payer: Managed Care, Other (non HMO) | Admitting: Lab

## 2012-02-13 ENCOUNTER — Ambulatory Visit (HOSPITAL_BASED_OUTPATIENT_CLINIC_OR_DEPARTMENT_OTHER): Payer: Managed Care, Other (non HMO) | Admitting: Hematology & Oncology

## 2012-02-13 VITALS — BP 121/76 | HR 88 | Temp 98.3°F

## 2012-02-13 DIAGNOSIS — C8585 Other specified types of non-Hodgkin lymphoma, lymph nodes of inguinal region and lower limb: Secondary | ICD-10-CM

## 2012-02-13 DIAGNOSIS — C8330 Diffuse large B-cell lymphoma, unspecified site: Secondary | ICD-10-CM

## 2012-02-13 DIAGNOSIS — Z5111 Encounter for antineoplastic chemotherapy: Secondary | ICD-10-CM

## 2012-02-13 DIAGNOSIS — Z5112 Encounter for antineoplastic immunotherapy: Secondary | ICD-10-CM

## 2012-02-13 DIAGNOSIS — C8590 Non-Hodgkin lymphoma, unspecified, unspecified site: Secondary | ICD-10-CM

## 2012-02-13 LAB — CBC WITH DIFFERENTIAL (CANCER CENTER ONLY)
BASO#: 0.1 10*3/uL (ref 0.0–0.2)
Eosinophils Absolute: 0.1 10*3/uL (ref 0.0–0.5)
HCT: 39.7 % (ref 38.7–49.9)
HGB: 13.9 g/dL (ref 13.0–17.1)
LYMPH%: 40.5 % (ref 14.0–48.0)
MCH: 30.9 pg (ref 28.0–33.4)
MCV: 88 fL (ref 82–98)
MONO#: 0.9 10*3/uL (ref 0.1–0.9)
NEUT%: 30.9 % — ABNORMAL LOW (ref 40.0–80.0)
RBC: 4.5 10*6/uL (ref 4.20–5.70)
WBC: 3.8 10*3/uL — ABNORMAL LOW (ref 4.0–10.0)

## 2012-02-13 LAB — COMPREHENSIVE METABOLIC PANEL
BUN: 13 mg/dL (ref 6–23)
CO2: 20 mEq/L (ref 19–32)
Creatinine, Ser: 0.98 mg/dL (ref 0.50–1.35)
Glucose, Bld: 139 mg/dL — ABNORMAL HIGH (ref 70–99)
Sodium: 139 mEq/L (ref 135–145)
Total Bilirubin: 0.4 mg/dL (ref 0.3–1.2)
Total Protein: 6.7 g/dL (ref 6.0–8.3)

## 2012-02-13 LAB — TECHNOLOGIST REVIEW CHCC SATELLITE

## 2012-02-13 LAB — LACTATE DEHYDROGENASE: LDH: 186 U/L (ref 94–250)

## 2012-02-13 MED ORDER — SODIUM CHLORIDE 0.9 % IV SOLN
375.0000 mg/m2 | Freq: Once | INTRAVENOUS | Status: AC
  Administered 2012-02-13: 800 mg via INTRAVENOUS
  Filled 2012-02-13: qty 80

## 2012-02-13 MED ORDER — SODIUM CHLORIDE 0.9 % IV SOLN
Freq: Once | INTRAVENOUS | Status: AC
  Administered 2012-02-13: 10:00:00 via INTRAVENOUS

## 2012-02-13 MED ORDER — DOXORUBICIN HCL CHEMO IV INJECTION 2 MG/ML
50.0000 mg/m2 | Freq: Once | INTRAVENOUS | Status: AC
  Administered 2012-02-13: 102 mg via INTRAVENOUS
  Filled 2012-02-13: qty 51

## 2012-02-13 MED ORDER — SODIUM CHLORIDE 0.9 % IV SOLN
750.0000 mg/m2 | Freq: Once | INTRAVENOUS | Status: AC
  Administered 2012-02-13: 1520 mg via INTRAVENOUS
  Filled 2012-02-13: qty 76

## 2012-02-13 MED ORDER — VINCRISTINE SULFATE CHEMO INJECTION 1 MG/ML
2.0000 mg | Freq: Once | INTRAVENOUS | Status: AC
  Administered 2012-02-13: 2 mg via INTRAVENOUS
  Filled 2012-02-13: qty 2

## 2012-02-13 MED ORDER — DIPHENHYDRAMINE HCL 25 MG PO CAPS
50.0000 mg | ORAL_CAPSULE | Freq: Once | ORAL | Status: AC
  Administered 2012-02-13: 50 mg via ORAL

## 2012-02-13 MED ORDER — ACETAMINOPHEN 325 MG PO TABS
650.0000 mg | ORAL_TABLET | Freq: Once | ORAL | Status: AC
  Administered 2012-02-13: 650 mg via ORAL

## 2012-02-13 MED ORDER — DEXAMETHASONE SODIUM PHOSPHATE 4 MG/ML IJ SOLN
20.0000 mg | Freq: Once | INTRAMUSCULAR | Status: AC
  Administered 2012-02-13: 20 mg via INTRAVENOUS

## 2012-02-13 MED ORDER — ONDANSETRON 16 MG/50ML IVPB (CHCC)
16.0000 mg | Freq: Once | INTRAVENOUS | Status: AC
  Administered 2012-02-13: 16 mg via INTRAVENOUS

## 2012-02-13 NOTE — Patient Instructions (Signed)
Cyclophosphamide injection What is this medicine? CYCLOPHOSPHAMIDE (sye kloe FOSS fa mide) is a chemotherapy drug. It slows the growth of cancer cells. This medicine is used to treat many types of cancer like lymphoma, myeloma, leukemia, breast cancer, and ovarian cancer, to name a few. It is also used to treat nephrotic syndrome in children. This medicine may be used for other purposes; ask your health care provider or pharmacist if you have questions. What should I tell my health care provider before I take this medicine? They need to know if you have any of these conditions: -blood disorders -history of other chemotherapy -history of radiation therapy -infection -kidney disease -liver disease -tumors in the bone marrow -an unusual or allergic reaction to cyclophosphamide, other chemotherapy, other medicines, foods, dyes, or preservatives -pregnant or trying to get pregnant -breast-feeding How should I use this medicine? This drug is usually given as an injection into a vein or muscle or by infusion into a vein. It is administered in a hospital or clinic by a specially trained health care professional. Talk to your pediatrician regarding the use of this medicine in children. While this drug may be prescribed for selected conditions, precautions do apply. Overdosage: If you think you have taken too much of this medicine contact a poison control center or emergency room at once. NOTE: This medicine is only for you. Do not share this medicine with others. What if I miss a dose? It is important not to miss your dose. Call your doctor or health care professional if you are unable to keep an appointment. What may interact with this medicine? Do not take this medicine with any of the following medications: -mibefradil -nalidixic acid This medicine may also interact with the following medications: -doxorubicin -etanercept -medicines to increase blood counts like filgrastim, pegfilgrastim,  sargramostim -medicines that block muscle or nerve pain -St. John's Wort -phenobarbital -succinylcholine chloride -trastuzumab -vaccines Talk to your doctor or health care professional before taking any of these medicines: -acetaminophen -aspirin -ibuprofen -ketoprofen -naproxen This list may not describe all possible interactions. Give your health care provider a list of all the medicines, herbs, non-prescription drugs, or dietary supplements you use. Also tell them if you smoke, drink alcohol, or use illegal drugs. Some items may interact with your medicine. What should I watch for while using this medicine? Visit your doctor for checks on your progress. This drug may make you feel generally unwell. This is not uncommon, as chemotherapy can affect healthy cells as well as cancer cells. Report any side effects. Continue your course of treatment even though you feel ill unless your doctor tells you to stop. Drink water or other fluids as directed. Urinate often, even at night. In some cases, you may be given additional medicines to help with side effects. Follow all directions for their use. Call your doctor or health care professional for advice if you get a fever, chills or sore throat, or other symptoms of a cold or flu. Do not treat yourself. This drug decreases your body's ability to fight infections. Try to avoid being around people who are sick. This medicine may increase your risk to bruise or bleed. Call your doctor or health care professional if you notice any unusual bleeding. Be careful brushing and flossing your teeth or using a toothpick because you may get an infection or bleed more easily. If you have any dental work done, tell your dentist you are receiving this medicine. Avoid taking products that contain aspirin, acetaminophen, ibuprofen, naproxen,  or ketoprofen unless instructed by your doctor. These medicines may hide a fever. Do not become pregnant while taking this  medicine. Women should inform their doctor if they wish to become pregnant or think they might be pregnant. There is a potential for serious side effects to an unborn child. Talk to your health care professional or pharmacist for more information. Do not breast-feed an infant while taking this medicine. Men should inform their doctor if they wish to father a child. This medicine may lower sperm counts. If you are going to have surgery, tell your doctor or health care professional that you have taken this medicine. What side effects may I notice from receiving this medicine? Side effects that you should report to your doctor or health care professional as soon as possible: -allergic reactions like skin rash, itching or hives, swelling of the face, lips, or tongue -low blood counts - this medicine may decrease the number of white blood cells, red blood cells and platelets. You may be at increased risk for infections and bleeding. -signs of infection - fever or chills, cough, sore throat, pain or difficulty passing urine -signs of decreased platelets or bleeding - bruising, pinpoint red spots on the skin, black, tarry stools, blood in the urine -signs of decreased red blood cells - unusually weak or tired, fainting spells, lightheadedness -breathing problems -dark urine -mouth sores -pain, swelling, redness at site where injected -swelling of the ankles, feet, hands -trouble passing urine or change in the amount of urine -weight gain -yellowing of the eyes or skin Side effects that usually do not require medical attention (report to your doctor or health care professional if they continue or are bothersome): -changes in nail or skin color -diarrhea -hair loss -loss of appetite -missed menstrual periods -nausea, vomiting -stomach pain This list may not describe all possible side effects. Call your doctor for medical advice about side effects. You may report side effects to FDA at  1-800-FDA-1088. Where should I keep my medicine? This drug is given in a hospital or clinic and will not be stored at home. NOTE: This sheet is a summary. It may not cover all possible information. If you have questions about this medicine, talk to your doctor, pharmacist, or health care provider.  2012, [employer]. (02/08/2008 2:32:25 PM)Vincristine injection What is this medicine? VINCRISTINE (vin KRIS teen) is a chemotherapy drug. It slows the growth of cancer cells. This medicine is used to treat many types of cancer like Hodgkin's disease, leukemia, non-Hodgkin's lymphoma, neuroblastoma (brain cancer), rhabdomyosarcoma, and Wilms' tumor. This medicine may be used for other purposes; ask your health care provider or pharmacist if you have questions. What should I tell my health care provider before I take this medicine? They need to know if you have any of these conditions: -blood disorders -gout -infection (especially chickenpox, cold sores, or herpes) -kidney disease -liver disease -lung disease -nervous system disease like Charcot-Marie-Tooth (CMT) -recent or ongoing radiation therapy -an unusual or allergic reaction to vincristine, other chemotherapy agents, other medicines, foods, dyes, or preservatives -pregnant or trying to get pregnant -breast-feeding How should I use this medicine? This drug is given as an infusion into a vein. It is administered in a hospital or clinic by a specially trained health care professional. If you have pain, swelling, burning, or any unusual feeling around the site of your injection, tell your health care professional right away. Talk to your pediatrician regarding the use of this medicine in children. While this drug  may be prescribed for selected conditions, precautions do apply. Overdosage: If you think you have taken too much of this medicine contact a poison control center or emergency room at once. NOTE: This medicine is only for  you. Do not share this medicine with others. What if I miss a dose? It is important not to miss your dose. Call your doctor or health care professional if you are unable to keep an appointment. What may interact with this medicine? Do not take this medicine with any of the following medications: -itraconazole -mibefradil -voriconazole This medicine may also interact with the following medications: -cyclosporine -erythromycin -fluconazole -ketoconazole -medicines for HIV like delavirdine, efavirenz, nevirapine -medicines for seizures like ethotoin, fosphenotoin, phenytoin -medicines to increase blood counts like filgrastim, pegfilgrastim, sargramostim -other chemotherapy drugs like cisplatin, L-asparaginase, methotrexate, mitomycin, paclitaxel -pegaspargase -vaccines -zalcitabine, ddC Talk to your doctor or health care professional before taking any of these medicines: -acetaminophen -aspirin -ibuprofen -ketoprofen -naproxen This list may not describe all possible interactions. Give your health care provider a list of all the medicines, herbs, non-prescription drugs, or dietary supplements you use. Also tell them if you smoke, drink alcohol, or use illegal drugs. Some items may interact with your medicine. What should I watch for while using this medicine? Your condition will be monitored carefully while you are receiving this medicine. You will need important blood work done while you are taking this medicine. This drug may make you feel generally unwell. This is not uncommon, as chemotherapy can affect healthy cells as well as cancer cells. Report any side effects. Continue your course of treatment even though you feel ill unless your doctor tells you to stop. In some cases, you may be given additional medicines to help with side effects. Follow all directions for their use. Call your doctor or health care professional for advice if you get a fever, chills or sore throat, or other  symptoms of a cold or flu. Do not treat yourself. Avoid taking products that contain aspirin, acetaminophen, ibuprofen, naproxen, or ketoprofen unless instructed by your doctor. These medicines may hide a fever. Do not become pregnant while taking this medicine. Women should inform their doctor if they wish to become pregnant or think they might be pregnant. There is a potential for serious side effects to an unborn child. Talk to your health care professional or pharmacist for more information. Do not breast-feed an infant while taking this medicine. Men may have a lower sperm count while taking this medicine. Talk to your doctor if you plan to father a child. What side effects may I notice from receiving this medicine? Side effects that you should report to your doctor or health care professional as soon as possible: -allergic reactions like skin rash, itching or hives, swelling of the face, lips, or tongue -breathing problems -confusion or changes in emotions or moods -constipation -cough -mouth sores -muscle weakness -nausea and vomiting -pain, swelling, redness or irritation at the injection site -pain, tingling, numbness in the hands or feet -problems with balance, talking, walking -seizures -stomach pain -trouble passing urine or change in the amount of urine Side effects that usually do not require medical attention (report to your doctor or health care professional if they continue or are bothersome): -diarrhea -hair loss -jaw pain -loss of appetite This list may not describe all possible side effects. Call your doctor for medical advice about side effects. You may report side effects to FDA at 1-800-FDA-1088. Where should I keep my medicine? This drug  is given in a hospital or clinic and will not be stored at home. NOTE: This sheet is a summary. It may not cover all possible information. If you have questions about this medicine, talk to your doctor, pharmacist, or health care  provider.  2012, [employer]. (07/31/2008 5:17:13 PM)Doxorubicin injection What is this medicine? DOXORUBICIN (dox oh ROO bi sin) is a chemotherapy drug. It is used to treat many kinds of cancer like Hodgkin's disease, leukemia, non-Hodgkin's lymphoma, neuroblastoma, sarcoma, and Wilms' tumor. It is also used to treat bladder cancer, breast cancer, lung cancer, ovarian cancer, stomach cancer, and thyroid cancer. This medicine may be used for other purposes; ask your health care provider or pharmacist if you have questions. What should I tell my health care provider before I take this medicine? They need to know if you have any of these conditions: -blood disorders -heart disease, recent heart attack -infection (especially a virus infection such as chickenpox, cold sores, or herpes) -irregular heartbeat -liver disease -recent or ongoing radiation therapy -an unusual or allergic reaction to doxorubicin, other chemotherapy agents, other medicines, foods, dyes, or preservatives -pregnant or trying to get pregnant -breast-feeding How should I use this medicine? This drug is given as an infusion into a vein. It is administered in a hospital or clinic by a specially trained health care professional. If you have pain, swelling, burning or any unusual feeling around the site of your injection, tell your health care professional right away. Talk to your pediatrician regarding the use of this medicine in children. Special care may be needed. Overdosage: If you think you have taken too much of this medicine contact a poison control center or emergency room at once. NOTE: This medicine is only for you. Do not share this medicine with others. What if I miss a dose? It is important not to miss your dose. Call your doctor or health care professional if you are unable to keep an appointment. What may interact with this medicine? Do not take this medicine with any of the following  medications: -cisapride -droperidol -halofantrine -pimozide -zidovudine This medicine may also interact with the following medications: -chloroquine -chlorpromazine -clarithromycin -cyclophosphamide -cyclosporine -erythromycin -medicines for depression, anxiety, or psychotic disturbances -medicines for irregular heart beat like amiodarone, bepridil, dofetilide, encainide, flecainide, propafenone, quinidine -medicines for seizures like ethotoin, fosphenytoin, phenytoin -medicines for nausea, vomiting like dolasetron, ondansetron, palonosetron -medicines to increase blood counts like filgrastim, pegfilgrastim, sargramostim -methadone -methotrexate -pentamidine -progesterone -vaccines -verapamil Talk to your doctor or health care professional before taking any of these medicines: -acetaminophen -aspirin -ibuprofen -ketoprofen -naproxen This list may not describe all possible interactions. Give your health care provider a list of all the medicines, herbs, non-prescription drugs, or dietary supplements you use. Also tell them if you smoke, drink alcohol, or use illegal drugs. Some items may interact with your medicine. What should I watch for while using this medicine? Your condition will be monitored carefully while you are receiving this medicine. You will need important blood work done while you are taking this medicine. This drug may make you feel generally unwell. This is not uncommon, as chemotherapy can affect healthy cells as well as cancer cells. Report any side effects. Continue your course of treatment even though you feel ill unless your doctor tells you to stop. Your urine may turn red for a few days after your dose. This is not blood. If your urine is dark or brown, call your doctor. In some cases, you may be given additional  medicines to help with side effects. Follow all directions for their use. Call your doctor or health care professional for advice if you get a  fever, chills or sore throat, or other symptoms of a cold or flu. Do not treat yourself. This drug decreases your body's ability to fight infections. Try to avoid being around people who are sick. This medicine may increase your risk to bruise or bleed. Call your doctor or health care professional if you notice any unusual bleeding. Be careful brushing and flossing your teeth or using a toothpick because you may get an infection or bleed more easily. If you have any dental work done, tell your dentist you are receiving this medicine. Avoid taking products that contain aspirin, acetaminophen, ibuprofen, naproxen, or ketoprofen unless instructed by your doctor. These medicines may hide a fever. Men and women of childbearing age should use effective birth control methods while using taking this medicine. Do not become pregnant while taking this medicine. There is a potential for serious side effects to an unborn child. Talk to your health care professional or pharmacist for more information. Do not breast-feed an infant while taking this medicine. Do not let others touch your urine or other body fluids for 5 days after each treatment with this medicine. Caregivers should wear latex gloves to avoid touching body fluids during this time. What side effects may I notice from receiving this medicine? Side effects that you should report to your doctor or health care professional as soon as possible: -allergic reactions like skin rash, itching or hives, swelling of the face, lips, or tongue -low blood counts - this medicine may decrease the number of white blood cells, red blood cells and platelets. You may be at increased risk for infections and bleeding. -signs of infection - fever or chills, cough, sore throat, pain or difficulty passing urine -signs of decreased platelets or bleeding - bruising, pinpoint red spots on the skin, black, tarry stools, blood in the urine -signs of decreased red blood cells -  unusually weak or tired, fainting spells, lightheadedness -breathing problems -chest pain -fast, irregular heartbeat -mouth sores -nausea, vomiting -pain, swelling, redness at site where injected -pain, tingling, numbness in the hands or feet -swelling of ankles, feet, or hands -unusual bleeding or bruising Side effects that usually do not require medical attention (report to your doctor or health care professional if they continue or are bothersome): -diarrhea -facial flushing -hair loss -loss of appetite -missed menstrual periods -nail discoloration or damage -red or watery eyes -red colored urine -stomach upset This list may not describe all possible side effects. Call your doctor for medical advice about side effects. You may report side effects to FDA at 1-800-FDA-1088. Where should I keep my medicine? This drug is given in a hospital or clinic and will not be stored at home. NOTE: This sheet is a summary. It may not cover all possible information. If you have questions about this medicine, talk to your doctor, pharmacist, or health care provider.  2012, [employer]. (02/22/2008 5:07:32 PM)Rituximab injection What is this medicine? RITUXIMAB (ri TUX i mab) is a monoclonal antibody. This medicine changes the way the body's immune system works. It is used commonly to treat non-Hodgkin's lymphoma and other conditions. In cancer cells, this drug targets a specific protein within cancer cells and stops the cancer cells from growing. It is also used to treat rhuematoid arthritis (RA). In RA, this medicine slow the inflammatory process and help reduce joint pain and swelling.  This medicine is often used with other cancer or arthritis medications. This medicine may be used for other purposes; ask your health care provider or pharmacist if you have questions. What should I tell my health care provider before I take this medicine? They need to know if you have any of these  conditions: -blood disorders -heart disease -history of hepatitis B -infection (especially a virus infection such as chickenpox, cold sores, or herpes) -irregular heartbeat -kidney disease -lung or breathing disease, like asthma -lupus -an unusual or allergic reaction to rituximab, mouse proteins, other medicines, foods, dyes, or preservatives -pregnant or trying to get pregnant -breast-feeding How should I use this medicine? This medicine is for infusion into a vein. It is administered in a hospital or clinic by a specially trained health care professional. A special MedGuide will be given to you by the pharmacist with each prescription and refill. Be sure to read this information carefully each time. Talk to your pediatrician regarding the use of this medicine in children. This medicine is not approved for use in children. Overdosage: If you think you have taken too much of this medicine contact a poison control center or emergency room at once. NOTE: This medicine is only for you. Do not share this medicine with others. What if I miss a dose? It is important not to miss a dose. Call your doctor or health care professional if you are unable to keep an appointment. What may interact with this medicine? -cisplatin -medicines for blood pressure -some other medicines for arthritis -vaccines This list may not describe all possible interactions. Give your health care provider a list of all the medicines, herbs, non-prescription drugs, or dietary supplements you use. Also tell them if you smoke, drink alcohol, or use illegal drugs. Some items may interact with your medicine. What should I watch for while using this medicine? Report any side effects that you notice during your treatment right away, such as changes in your breathing, fever, chills, dizziness or lightheadedness. These effects are more common with the first dose. Visit your prescriber or health care professional for checks on your  progress. You will need to have regular blood work. Report any other side effects. The side effects of this medicine can continue after you finish your treatment. Continue your course of treatment even though you feel ill unless your doctor tells you to stop. Call your doctor or health care professional for advice if you get a fever, chills or sore throat, or other symptoms of a cold or flu. Do not treat yourself. This drug decreases your body's ability to fight infections. Try to avoid being around people who are sick. This medicine may increase your risk to bruise or bleed. Call your doctor or health care professional if you notice any unusual bleeding. Be careful brushing and flossing your teeth or using a toothpick because you may get an infection or bleed more easily. If you have any dental work done, tell your dentist you are receiving this medicine. Avoid taking products that contain aspirin, acetaminophen, ibuprofen, naproxen, or ketoprofen unless instructed by your doctor. These medicines may hide a fever. Do not become pregnant while taking this medicine. Women should inform their doctor if they wish to become pregnant or think they might be pregnant. There is a potential for serious side effects to an unborn child. Talk to your health care professional or pharmacist for more information. Do not breast-feed an infant while taking this medicine. What side effects may I notice  from receiving this medicine? Side effects that you should report to your doctor or health care professional as soon as possible: -allergic reactions like skin rash, itching or hives, swelling of the face, lips, or tongue -low blood counts - this medicine may decrease the number of white blood cells, red blood cells and platelets. You may be at increased risk for infections and bleeding. -signs of infection - fever or chills, cough, sore throat, pain or difficulty passing urine -signs of decreased platelets or bleeding -  bruising, pinpoint red spots on the skin, black, tarry stools, blood in the urine -signs of decreased red blood cells - unusually weak or tired, fainting spells, lightheadedness -breathing problems -confused, not responsive -chest pain -fast, irregular heartbeat -feeling faint or lightheaded, falls -mouth sores -redness, blistering, peeling or loosening of the skin, including inside the mouth -stomach pain -swelling of the ankles, feet, or hands -trouble passing urine or change in the amount of urine Side effects that usually do not require medical attention (report to your doctor or other health care professional if they continue or are bothersome): -anxiety -headache -loss of appetite -muscle aches -nausea -night sweats This list may not describe all possible side effects. Call your doctor for medical advice about side effects. You may report side effects to FDA at 1-800-FDA-1088. Where should I keep my medicine? This drug is given in a hospital or clinic and will not be stored at home. NOTE: This sheet is a summary. It may not cover all possible information. If you have questions about this medicine, talk to your doctor, pharmacist, or health care provider.  2012, [employer]. (07/03/2008 2:04:59 PM)

## 2012-02-13 NOTE — Progress Notes (Signed)
This office note has been dictated.

## 2012-02-14 ENCOUNTER — Ambulatory Visit: Payer: Managed Care, Other (non HMO)

## 2012-02-14 NOTE — Progress Notes (Signed)
CC:   Lupita Raider, M.D. 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  CURRENT THERAPY:  The patient is status post three cycles of R-CHOP.  INTERIM HISTORY:  [name] comes in for his follow-up.  He is beginning to get a little frustrated with taking treatment.  He was thinking about stopping his treatment after this 4th cycle.  I talked to him at length about this.  I told him that he really was compromising his outcome and prognosis if he stopped treatment right now.  Even though the last PET scan did not show any obvious residual lymphoma, he is at risk for recurrence.  He mostly is at risk for CNS relapse.  He has had some nausea but no vomiting.  He just has no taste for food. I wish there was something we could do about this.  He is on some magic mouthwash.  He does not require any Neulasta.  He has had a good tolerance to treatment with his bone marrow.  He has had no headache.  He has had no cough.  He has had no diarrhea.  PHYSICAL EXAMINATION:  General Appearance:  This is a well-developed, well-nourished white gentleman in no obvious distress.  Vital Signs: 96.8, pulse 84, respiratory rate 18, blood pressure 118/69.  Weight is 189.  Head and Neck Exam:  Shows a normocephalic, atraumatic skull. There are no ocular or oral lesions.  There are no palpable cervical or supraclavicular lymph nodes.  Lungs:  Clear bilaterally.  Cardiac Exam: Regular rate and rhythm with a normal S1 and S2.  There are no murmurs, rubs or bruits.  Abdominal Exam:  Soft with good bowel sounds.  He has a well-healed left inguinal orchiectomy scar.  There is no fluid wave. There is no palpable hepatosplenomegaly.  Back Exam:  No tenderness over the spine, ribs or hips.  Extremities:  Show no clubbing, cyanosis or edema.  Skin Exam:  No rashes, ecchymosis, or petechia.  LABORATORY STUDIES:  White cell count is 3.8, hemoglobin 14, hematocrit 40,  platelet count is 243.  ANC is 1.2.  IMPRESSION:  [name] is a 66 year old gentleman with large cell lymphoma of the left testicle.  He has had three cycles of chemotherapy with R-CHOP.  So far, his response has been very nice.  After talking to him, he will continue with treatment.  Again, I really believe that he is doing himself a huge favor by continuing on treatment.  We will plan to get him back in 3 weeks for his 5th cycle.  I believe that all we will need is 6 cycles.    ______________________________ [name], M.D. PRE/MEDQ  D:  02/13/2012  T:  02/14/2012  Job:  [id number]

## 2012-02-20 ENCOUNTER — Telehealth: Payer: Self-pay | Admitting: *Deleted

## 2012-02-20 DIAGNOSIS — C8330 Diffuse large B-cell lymphoma, unspecified site: Secondary | ICD-10-CM

## 2012-02-20 MED ORDER — ONDANSETRON HCL 8 MG PO TABS
ORAL_TABLET | ORAL | Status: DC
Start: 2012-02-20 — End: 2012-09-14

## 2012-02-20 NOTE — Telephone Encounter (Signed)
Pt called stating he didn't have any problem with "stomach issues" after his 1st 3 tx's but he he is with this one. He denies nausea per se but more stomach pain. He is finishing up his Prednisone. Confirmed that he is taking it with food. To start an over the counter PPI to see if that helps. Explained that Prednisone can often irritate the stomach. Of note..he also denies having diarrhea. Will also refill his Zofran to have prn because he travels a lot and the Ativan and Compazine make him drowsy. He verbalized understanding of all of the instructions.

## 2012-03-05 ENCOUNTER — Ambulatory Visit (HOSPITAL_BASED_OUTPATIENT_CLINIC_OR_DEPARTMENT_OTHER): Payer: Managed Care, Other (non HMO)

## 2012-03-05 ENCOUNTER — Ambulatory Visit: Payer: Managed Care, Other (non HMO) | Admitting: Hematology & Oncology

## 2012-03-05 ENCOUNTER — Ambulatory Visit (HOSPITAL_BASED_OUTPATIENT_CLINIC_OR_DEPARTMENT_OTHER): Payer: Managed Care, Other (non HMO) | Admitting: Lab

## 2012-03-05 ENCOUNTER — Other Ambulatory Visit: Payer: Managed Care, Other (non HMO) | Admitting: Lab

## 2012-03-05 ENCOUNTER — Ambulatory Visit: Payer: Managed Care, Other (non HMO)

## 2012-03-05 VITALS — BP 122/66 | HR 77 | Temp 97.0°F

## 2012-03-05 DIAGNOSIS — Z5112 Encounter for antineoplastic immunotherapy: Secondary | ICD-10-CM

## 2012-03-05 DIAGNOSIS — C8590 Non-Hodgkin lymphoma, unspecified, unspecified site: Secondary | ICD-10-CM

## 2012-03-05 DIAGNOSIS — C8330 Diffuse large B-cell lymphoma, unspecified site: Secondary | ICD-10-CM

## 2012-03-05 DIAGNOSIS — Z5111 Encounter for antineoplastic chemotherapy: Secondary | ICD-10-CM

## 2012-03-05 DIAGNOSIS — C8595 Non-Hodgkin lymphoma, unspecified, lymph nodes of inguinal region and lower limb: Secondary | ICD-10-CM

## 2012-03-05 LAB — CBC WITH DIFFERENTIAL (CANCER CENTER ONLY)
BASO#: 0.1 10*3/uL (ref 0.0–0.2)
BASO%: 1.9 % (ref 0.0–2.0)
EOS%: 1.9 % (ref 0.0–7.0)
HGB: 13.4 g/dL (ref 13.0–17.1)
LYMPH#: 1.4 10*3/uL (ref 0.9–3.3)
MCHC: 35 g/dL (ref 32.0–35.9)
MONO#: 0.9 10*3/uL (ref 0.1–0.9)
NEUT#: 1.3 10*3/uL — ABNORMAL LOW (ref 1.5–6.5)
RDW: 14.6 % (ref 11.1–15.7)
WBC: 3.7 10*3/uL — ABNORMAL LOW (ref 4.0–10.0)

## 2012-03-05 LAB — COMPREHENSIVE METABOLIC PANEL
ALT: 50 U/L (ref 0–53)
AST: 28 U/L (ref 0–37)
Albumin: 4.1 g/dL (ref 3.5–5.2)
Calcium: 9.4 mg/dL (ref 8.4–10.5)
Chloride: 105 mEq/L (ref 96–112)
Potassium: 4.1 mEq/L (ref 3.5–5.3)

## 2012-03-05 MED ORDER — ACETAMINOPHEN 325 MG PO TABS
650.0000 mg | ORAL_TABLET | Freq: Once | ORAL | Status: AC
  Administered 2012-03-05: 650 mg via ORAL

## 2012-03-05 MED ORDER — DOXORUBICIN HCL CHEMO IV INJECTION 2 MG/ML
50.0000 mg/m2 | Freq: Once | INTRAVENOUS | Status: AC
  Administered 2012-03-05: 102 mg via INTRAVENOUS
  Filled 2012-03-05: qty 51

## 2012-03-05 MED ORDER — RITUXIMAB CHEMO INJECTION 10 MG/ML
375.0000 mg/m2 | Freq: Once | INTRAVENOUS | Status: AC
  Administered 2012-03-05: 800 mg via INTRAVENOUS
  Filled 2012-03-05: qty 80

## 2012-03-05 MED ORDER — SODIUM CHLORIDE 0.9 % IV SOLN
750.0000 mg/m2 | Freq: Once | INTRAVENOUS | Status: AC
  Administered 2012-03-05: 1520 mg via INTRAVENOUS
  Filled 2012-03-05: qty 76

## 2012-03-05 MED ORDER — DEXAMETHASONE SODIUM PHOSPHATE 4 MG/ML IJ SOLN
20.0000 mg | Freq: Once | INTRAMUSCULAR | Status: AC
  Administered 2012-03-05: 20 mg via INTRAVENOUS

## 2012-03-05 MED ORDER — DIPHENHYDRAMINE HCL 25 MG PO CAPS
50.0000 mg | ORAL_CAPSULE | Freq: Once | ORAL | Status: AC
  Administered 2012-03-05: 50 mg via ORAL

## 2012-03-05 MED ORDER — ONDANSETRON 16 MG/50ML IVPB (CHCC)
16.0000 mg | Freq: Once | INTRAVENOUS | Status: AC
  Administered 2012-03-05: 16 mg via INTRAVENOUS

## 2012-03-05 MED ORDER — SODIUM CHLORIDE 0.9 % IJ SOLN
2.0000 mg | Freq: Once | INTRAMUSCULAR | Status: AC
  Administered 2012-03-05: 2 mg via INTRAVENOUS
  Filled 2012-03-05: qty 2

## 2012-03-05 MED ORDER — SODIUM CHLORIDE 0.9 % IV SOLN
Freq: Once | INTRAVENOUS | Status: AC
  Administered 2012-03-05: 09:00:00 via INTRAVENOUS

## 2012-03-05 NOTE — Patient Instructions (Signed)
Rituximab injection What is this medicine? RITUXIMAB (ri TUX i mab) is a monoclonal antibody. This medicine changes the way the body's immune system works. It is used commonly to treat non-Hodgkin's lymphoma and other conditions. In cancer cells, this drug targets a specific protein within cancer cells and stops the cancer cells from growing. It is also used to treat rhuematoid arthritis (RA). In RA, this medicine slow the inflammatory process and help reduce joint pain and swelling. This medicine is often used with other cancer or arthritis medications. This medicine may be used for other purposes; ask your health care provider or pharmacist if you have questions. What should I tell my health care provider before I take this medicine? They need to know if you have any of these conditions: -blood disorders -heart disease -history of hepatitis B -infection (especially a virus infection such as chickenpox, cold sores, or herpes) -irregular heartbeat -kidney disease -lung or breathing disease, like asthma -lupus -an unusual or allergic reaction to rituximab, mouse proteins, other medicines, foods, dyes, or preservatives -pregnant or trying to get pregnant -breast-feeding How should I use this medicine? This medicine is for infusion into a vein. It is administered in a hospital or clinic by a specially trained health care professional. A special MedGuide will be given to you by the pharmacist with each prescription and refill. Be sure to read this information carefully each time. Talk to your pediatrician regarding the use of this medicine in children. This medicine is not approved for use in children. Overdosage: If you think you have taken too much of this medicine contact a poison control center or emergency room at once. NOTE: This medicine is only for you. Do not share this medicine with others. What if I miss a dose? It is important not to miss a dose. Call your doctor or health care  professional if you are unable to keep an appointment. What may interact with this medicine? -cisplatin -medicines for blood pressure -some other medicines for arthritis -vaccines This list may not describe all possible interactions. Give your health care provider a list of all the medicines, herbs, non-prescription drugs, or dietary supplements you use. Also tell them if you smoke, drink alcohol, or use illegal drugs. Some items may interact with your medicine. What should I watch for while using this medicine? Report any side effects that you notice during your treatment right away, such as changes in your breathing, fever, chills, dizziness or lightheadedness. These effects are more common with the first dose. Visit your prescriber or health care professional for checks on your progress. You will need to have regular blood work. Report any other side effects. The side effects of this medicine can continue after you finish your treatment. Continue your course of treatment even though you feel ill unless your doctor tells you to stop. Call your doctor or health care professional for advice if you get a fever, chills or sore throat, or other symptoms of a cold or flu. Do not treat yourself. This drug decreases your body's ability to fight infections. Try to avoid being around people who are sick. This medicine may increase your risk to bruise or bleed. Call your doctor or health care professional if you notice any unusual bleeding. Be careful brushing and flossing your teeth or using a toothpick because you may get an infection or bleed more easily. If you have any dental work done, tell your dentist you are receiving this medicine. Avoid taking products that contain aspirin, acetaminophen,   ibuprofen, naproxen, or ketoprofen unless instructed by your doctor. These medicines may hide a fever. Do not become pregnant while taking this medicine. Women should inform their doctor if they wish to become  pregnant or think they might be pregnant. There is a potential for serious side effects to an unborn child. Talk to your health care professional or pharmacist for more information. Do not breast-feed an infant while taking this medicine. What side effects may I notice from receiving this medicine? Side effects that you should report to your doctor or health care professional as soon as possible: -allergic reactions like skin rash, itching or hives, swelling of the face, lips, or tongue -low blood counts - this medicine may decrease the number of white blood cells, red blood cells and platelets. You may be at increased risk for infections and bleeding. -signs of infection - fever or chills, cough, sore throat, pain or difficulty passing urine -signs of decreased platelets or bleeding - bruising, pinpoint red spots on the skin, black, tarry stools, blood in the urine -signs of decreased red blood cells - unusually weak or tired, fainting spells, lightheadedness -breathing problems -confused, not responsive -chest pain -fast, irregular heartbeat -feeling faint or lightheaded, falls -mouth sores -redness, blistering, peeling or loosening of the skin, including inside the mouth -stomach pain -swelling of the ankles, feet, or hands -trouble passing urine or change in the amount of urine Side effects that usually do not require medical attention (report to your doctor or other health care professional if they continue or are bothersome): -anxiety -headache -loss of appetite -muscle aches -nausea -night sweats This list may not describe all possible side effects. Call your doctor for medical advice about side effects. You may report side effects to FDA at 1-800-FDA-1088. Where should I keep my medicine? This drug is given in a hospital or clinic and will not be stored at home. NOTE: This sheet is a summary. It may not cover all possible information. If you have questions about this medicine,  talk to your doctor, pharmacist, or health care provider.  2012, [employer]. (07/03/2008 2:04:59 PM)Cyclophosphamide injection What is this medicine? CYCLOPHOSPHAMIDE (sye kloe FOSS fa mide) is a chemotherapy drug. It slows the growth of cancer cells. This medicine is used to treat many types of cancer like lymphoma, myeloma, leukemia, breast cancer, and ovarian cancer, to name a few. It is also used to treat nephrotic syndrome in children. This medicine may be used for other purposes; ask your health care provider or pharmacist if you have questions. What should I tell my health care provider before I take this medicine? They need to know if you have any of these conditions: -blood disorders -history of other chemotherapy -history of radiation therapy -infection -kidney disease -liver disease -tumors in the bone marrow -an unusual or allergic reaction to cyclophosphamide, other chemotherapy, other medicines, foods, dyes, or preservatives -pregnant or trying to get pregnant -breast-feeding How should I use this medicine? This drug is usually given as an injection into a vein or muscle or by infusion into a vein. It is administered in a hospital or clinic by a specially trained health care professional. Talk to your pediatrician regarding the use of this medicine in children. While this drug may be prescribed for selected conditions, precautions do apply. Overdosage: If you think you have taken too much of this medicine contact a poison control center or emergency room at once. NOTE: This medicine is only for you. Do not share this medicine  with others. What if I miss a dose? It is important not to miss your dose. Call your doctor or health care professional if you are unable to keep an appointment. What may interact with this medicine? Do not take this medicine with any of the following medications: -mibefradil -nalidixic acid This medicine may also interact with the following  medications: -doxorubicin -etanercept -medicines to increase blood counts like filgrastim, pegfilgrastim, sargramostim -medicines that block muscle or nerve pain -St. John's Wort -phenobarbital -succinylcholine chloride -trastuzumab -vaccines Talk to your doctor or health care professional before taking any of these medicines: -acetaminophen -aspirin -ibuprofen -ketoprofen -naproxen This list may not describe all possible interactions. Give your health care provider a list of all the medicines, herbs, non-prescription drugs, or dietary supplements you use. Also tell them if you smoke, drink alcohol, or use illegal drugs. Some items may interact with your medicine. What should I watch for while using this medicine? Visit your doctor for checks on your progress. This drug may make you feel generally unwell. This is not uncommon, as chemotherapy can affect healthy cells as well as cancer cells. Report any side effects. Continue your course of treatment even though you feel ill unless your doctor tells you to stop. Drink water or other fluids as directed. Urinate often, even at night. In some cases, you may be given additional medicines to help with side effects. Follow all directions for their use. Call your doctor or health care professional for advice if you get a fever, chills or sore throat, or other symptoms of a cold or flu. Do not treat yourself. This drug decreases your body's ability to fight infections. Try to avoid being around people who are sick. This medicine may increase your risk to bruise or bleed. Call your doctor or health care professional if you notice any unusual bleeding. Be careful brushing and flossing your teeth or using a toothpick because you may get an infection or bleed more easily. If you have any dental work done, tell your dentist you are receiving this medicine. Avoid taking products that contain aspirin, acetaminophen, ibuprofen, naproxen, or ketoprofen unless  instructed by your doctor. These medicines may hide a fever. Do not become pregnant while taking this medicine. Women should inform their doctor if they wish to become pregnant or think they might be pregnant. There is a potential for serious side effects to an unborn child. Talk to your health care professional or pharmacist for more information. Do not breast-feed an infant while taking this medicine. Men should inform their doctor if they wish to father a child. This medicine may lower sperm counts. If you are going to have surgery, tell your doctor or health care professional that you have taken this medicine. What side effects may I notice from receiving this medicine? Side effects that you should report to your doctor or health care professional as soon as possible: -allergic reactions like skin rash, itching or hives, swelling of the face, lips, or tongue -low blood counts - this medicine may decrease the number of white blood cells, red blood cells and platelets. You may be at increased risk for infections and bleeding. -signs of infection - fever or chills, cough, sore throat, pain or difficulty passing urine -signs of decreased platelets or bleeding - bruising, pinpoint red spots on the skin, black, tarry stools, blood in the urine -signs of decreased red blood cells - unusually weak or tired, fainting spells, lightheadedness -breathing problems -dark urine -mouth sores -pain, swelling, redness  at site where injected -swelling of the ankles, feet, hands -trouble passing urine or change in the amount of urine -weight gain -yellowing of the eyes or skin Side effects that usually do not require medical attention (report to your doctor or health care professional if they continue or are bothersome): -changes in nail or skin color -diarrhea -hair loss -loss of appetite -missed menstrual periods -nausea, vomiting -stomach pain This list may not describe all possible side effects. Call  your doctor for medical advice about side effects. You may report side effects to FDA at 1-800-FDA-1088. Where should I keep my medicine? This drug is given in a hospital or clinic and will not be stored at home. NOTE: This sheet is a summary. It may not cover all possible information. If you have questions about this medicine, talk to your doctor, pharmacist, or health care provider.  2012, [employer] Standard. (02/08/2008 2:32:25 PM)Vincristine injection What is this medicine? VINCRISTINE (vin KRIS teen) is a chemotherapy drug. It slows the growth of cancer cells. This medicine is used to treat many types of cancer like Hodgkin's disease, leukemia, non-Hodgkin's lymphoma, neuroblastoma (brain cancer), rhabdomyosarcoma, and Wilms' tumor. This medicine may be used for other purposes; ask your health care provider or pharmacist if you have questions. What should I tell my health care provider before I take this medicine? They need to know if you have any of these conditions: -blood disorders -gout -infection (especially chickenpox, cold sores, or herpes) -kidney disease -liver disease -lung disease -nervous system disease like Charcot-Marie-Tooth (CMT) -recent or ongoing radiation therapy -an unusual or allergic reaction to vincristine, other chemotherapy agents, other medicines, foods, dyes, or preservatives -pregnant or trying to get pregnant -breast-feeding How should I use this medicine? This drug is given as an infusion into a vein. It is administered in a hospital or clinic by a specially trained health care professional. If you have pain, swelling, burning, or any unusual feeling around the site of your injection, tell your health care professional right away. Talk to your pediatrician regarding the use of this medicine in children. While this drug may be prescribed for selected conditions, precautions do apply. Overdosage: If you think you have taken too much of this medicine  contact a poison control center or emergency room at once. NOTE: This medicine is only for you. Do not share this medicine with others. What if I miss a dose? It is important not to miss your dose. Call your doctor or health care professional if you are unable to keep an appointment. What may interact with this medicine? Do not take this medicine with any of the following medications: -itraconazole -mibefradil -voriconazole This medicine may also interact with the following medications: -cyclosporine -erythromycin -fluconazole -ketoconazole -medicines for HIV like delavirdine, efavirenz, nevirapine -medicines for seizures like ethotoin, fosphenotoin, phenytoin -medicines to increase blood counts like filgrastim, pegfilgrastim, sargramostim -other chemotherapy drugs like cisplatin, L-asparaginase, methotrexate, mitomycin, paclitaxel -pegaspargase -vaccines -zalcitabine, ddC Talk to your doctor or health care professional before taking any of these medicines: -acetaminophen -aspirin -ibuprofen -ketoprofen -naproxen This list may not describe all possible interactions. Give your health care provider a list of all the medicines, herbs, non-prescription drugs, or dietary supplements you use. Also tell them if you smoke, drink alcohol, or use illegal drugs. Some items may interact with your medicine. What should I watch for while using this medicine? Your condition will be monitored carefully while you are receiving this medicine. You will need important blood work done while you  are taking this medicine. This drug may make you feel generally unwell. This is not uncommon, as chemotherapy can affect healthy cells as well as cancer cells. Report any side effects. Continue your course of treatment even though you feel ill unless your doctor tells you to stop. In some cases, you may be given additional medicines to help with side effects. Follow all directions for their use. Call your doctor or  health care professional for advice if you get a fever, chills or sore throat, or other symptoms of a cold or flu. Do not treat yourself. Avoid taking products that contain aspirin, acetaminophen, ibuprofen, naproxen, or ketoprofen unless instructed by your doctor. These medicines may hide a fever. Do not become pregnant while taking this medicine. Women should inform their doctor if they wish to become pregnant or think they might be pregnant. There is a potential for serious side effects to an unborn child. Talk to your health care professional or pharmacist for more information. Do not breast-feed an infant while taking this medicine. Men may have a lower sperm count while taking this medicine. Talk to your doctor if you plan to father a child. What side effects may I notice from receiving this medicine? Side effects that you should report to your doctor or health care professional as soon as possible: -allergic reactions like skin rash, itching or hives, swelling of the face, lips, or tongue -breathing problems -confusion or changes in emotions or moods -constipation -cough -mouth sores -muscle weakness -nausea and vomiting -pain, swelling, redness or irritation at the injection site -pain, tingling, numbness in the hands or feet -problems with balance, talking, walking -seizures -stomach pain -trouble passing urine or change in the amount of urine Side effects that usually do not require medical attention (report to your doctor or health care professional if they continue or are bothersome): -diarrhea -hair loss -jaw pain -loss of appetite This list may not describe all possible side effects. Call your doctor for medical advice about side effects. You may report side effects to FDA at 1-800-FDA-1088. Where should I keep my medicine? This drug is given in a hospital or clinic and will not be stored at home. NOTE: This sheet is a summary. It may not cover all possible information. If  you have questions about this medicine, talk to your doctor, pharmacist, or health care provider.  2012, [employer]. (07/31/2008 5:17:13 PM)Doxorubicin injection What is this medicine? DOXORUBICIN (dox oh ROO bi sin) is a chemotherapy drug. It is used to treat many kinds of cancer like Hodgkin's disease, leukemia, non-Hodgkin's lymphoma, neuroblastoma, sarcoma, and Wilms' tumor. It is also used to treat bladder cancer, breast cancer, lung cancer, ovarian cancer, stomach cancer, and thyroid cancer. This medicine may be used for other purposes; ask your health care provider or pharmacist if you have questions. What should I tell my health care provider before I take this medicine? They need to know if you have any of these conditions: -blood disorders -heart disease, recent heart attack -infection (especially a virus infection such as chickenpox, cold sores, or herpes) -irregular heartbeat -liver disease -recent or ongoing radiation therapy -an unusual or allergic reaction to doxorubicin, other chemotherapy agents, other medicines, foods, dyes, or preservatives -pregnant or trying to get pregnant -breast-feeding How should I use this medicine? This drug is given as an infusion into a vein. It is administered in a hospital or clinic by a specially trained health care professional. If you have pain, swelling, burning or any unusual  feeling around the site of your injection, tell your health care professional right away. Talk to your pediatrician regarding the use of this medicine in children. Special care may be needed. Overdosage: If you think you have taken too much of this medicine contact a poison control center or emergency room at once. NOTE: This medicine is only for you. Do not share this medicine with others. What if I miss a dose? It is important not to miss your dose. Call your doctor or health care professional if you are unable to keep an appointment. What may interact  with this medicine? Do not take this medicine with any of the following medications: -cisapride -droperidol -halofantrine -pimozide -zidovudine This medicine may also interact with the following medications: -chloroquine -chlorpromazine -clarithromycin -cyclophosphamide -cyclosporine -erythromycin -medicines for depression, anxiety, or psychotic disturbances -medicines for irregular heart beat like amiodarone, bepridil, dofetilide, encainide, flecainide, propafenone, quinidine -medicines for seizures like ethotoin, fosphenytoin, phenytoin -medicines for nausea, vomiting like dolasetron, ondansetron, palonosetron -medicines to increase blood counts like filgrastim, pegfilgrastim, sargramostim -methadone -methotrexate -pentamidine -progesterone -vaccines -verapamil Talk to your doctor or health care professional before taking any of these medicines: -acetaminophen -aspirin -ibuprofen -ketoprofen -naproxen This list may not describe all possible interactions. Give your health care provider a list of all the medicines, herbs, non-prescription drugs, or dietary supplements you use. Also tell them if you smoke, drink alcohol, or use illegal drugs. Some items may interact with your medicine. What should I watch for while using this medicine? Your condition will be monitored carefully while you are receiving this medicine. You will need important blood work done while you are taking this medicine. This drug may make you feel generally unwell. This is not uncommon, as chemotherapy can affect healthy cells as well as cancer cells. Report any side effects. Continue your course of treatment even though you feel ill unless your doctor tells you to stop. Your urine may turn red for a few days after your dose. This is not blood. If your urine is dark or brown, call your doctor. In some cases, you may be given additional medicines to help with side effects. Follow all directions for their  use. Call your doctor or health care professional for advice if you get a fever, chills or sore throat, or other symptoms of a cold or flu. Do not treat yourself. This drug decreases your body's ability to fight infections. Try to avoid being around people who are sick. This medicine may increase your risk to bruise or bleed. Call your doctor or health care professional if you notice any unusual bleeding. Be careful brushing and flossing your teeth or using a toothpick because you may get an infection or bleed more easily. If you have any dental work done, tell your dentist you are receiving this medicine. Avoid taking products that contain aspirin, acetaminophen, ibuprofen, naproxen, or ketoprofen unless instructed by your doctor. These medicines may hide a fever. Men and women of childbearing age should use effective birth control methods while using taking this medicine. Do not become pregnant while taking this medicine. There is a potential for serious side effects to an unborn child. Talk to your health care professional or pharmacist for more information. Do not breast-feed an infant while taking this medicine. Do not let others touch your urine or other body fluids for 5 days after each treatment with this medicine. Caregivers should wear latex gloves to avoid touching body fluids during this time. What side effects may I notice from  receiving this medicine? Side effects that you should report to your doctor or health care professional as soon as possible: -allergic reactions like skin rash, itching or hives, swelling of the face, lips, or tongue -low blood counts - this medicine may decrease the number of white blood cells, red blood cells and platelets. You may be at increased risk for infections and bleeding. -signs of infection - fever or chills, cough, sore throat, pain or difficulty passing urine -signs of decreased platelets or bleeding - bruising, pinpoint red spots on the skin, black,  tarry stools, blood in the urine -signs of decreased red blood cells - unusually weak or tired, fainting spells, lightheadedness -breathing problems -chest pain -fast, irregular heartbeat -mouth sores -nausea, vomiting -pain, swelling, redness at site where injected -pain, tingling, numbness in the hands or feet -swelling of ankles, feet, or hands -unusual bleeding or bruising Side effects that usually do not require medical attention (report to your doctor or health care professional if they continue or are bothersome): -diarrhea -facial flushing -hair loss -loss of appetite -missed menstrual periods -nail discoloration or damage -red or watery eyes -red colored urine -stomach upset This list may not describe all possible side effects. Call your doctor for medical advice about side effects. You may report side effects to FDA at 1-800-FDA-1088. Where should I keep my medicine? This drug is given in a hospital or clinic and will not be stored at home. NOTE: This sheet is a summary. It may not cover all possible information. If you have questions about this medicine, talk to your doctor, pharmacist, or health care provider.  2012, [employer]. (02/22/2008 5:07:32 PM)

## 2012-03-05 NOTE — Progress Notes (Signed)
CC:   Lupita Raider, M.D. 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  CURRENT THERAPY:  The patient is status post 4 cycles of R-CHOP.  INTERIM HISTORY:  [name] comes in for followup.  He continues to do well.  He has actually tolerated chemotherapy quite nicely.  He does have some mouth sores.  These seem to improve with mouth rinsing.  He does state that his "stomach feels cold."  This certainly may be an effect from the chemotherapy.  He has had no problems with fever.  There has been no bony pain.  He has had no rashes.  There has been no leg swelling.  He has had no headache. He has had no double vision or blurred vision.  PHYSICAL EXAMINATION:  This is a well-developed, well-nourished white gentleman in no obvious distress.  Vital signs show a temperature of 96.7, pulse 85, respiratory rate 18, blood pressure 127/78.  Weight is 190.  Head and neck exam shows a normocephalic, atraumatic skull.  There are no ocular or oral lesions.  There are no palpable cervical or supraclavicular lymph nodes.  Lungs:  Clear bilaterally.  Cardiac: Regular rate and rhythm with a normal S1 and S2.  There are no murmurs, rubs or bruits.  Abdomen:  Soft with good bowel sounds.  There is no palpable abdominal mass.  He has a well-healed left inguinal orchiectomy scar.  There is no fluid wave.  There is no palpable hepatosplenomegaly. Extremities show no clubbing, cyanosis or edema.  Neurologic:  No focal neurological deficits.  Skin:  No rashes, ecchymosis or petechia.  LABORATORY STUDIES:  White cell count 3.7, hemoglobin 13.4, hematocrit 38.3, platelet count 233.  IMPRESSION:  [name] is a 66 year old gentleman with primary diffuse large-cell lymphoma of the left testicle.  He did ultimately undergo an orchiectomy.  He is on "phase 1" of treatment.  He is getting systemic chemotherapy. He is on R-CHOP.  We will plan for his 5th cycle  today.  After 6 cycles of CHOP, we will then repeat his staging studies.  Will get a PET scan on him.  We will then plan to proceed with CNS prophylaxis.  I will then follow that up with scrotal radiation therapy.  We will go ahead and plan to get [name] back in another 3 weeks.    ______________________________ Josph Macho, M.D. PRE/MEDQ  D:  03/05/2012  T:  03/05/2012  Job:  [id number]

## 2012-03-05 NOTE — Progress Notes (Signed)
This office note has been dictated.

## 2012-03-06 ENCOUNTER — Ambulatory Visit: Payer: Managed Care, Other (non HMO)

## 2012-03-10 ENCOUNTER — Telehealth: Payer: Self-pay | Admitting: *Deleted

## 2012-03-10 NOTE — Telephone Encounter (Signed)
Pt called stating the side effects were lasting longer with this cycle. He is currently nauseated & having diarrhea. This is his last day of Prednisone and is concerned about not being able to take it because he is unable to eat. He hasn't taken anything for nausea as he is working. Asked that he take Zofran with a small sip of liquid and try to remain still for a while.

## 2012-03-26 ENCOUNTER — Ambulatory Visit: Payer: Managed Care, Other (non HMO)

## 2012-03-26 ENCOUNTER — Ambulatory Visit (HOSPITAL_BASED_OUTPATIENT_CLINIC_OR_DEPARTMENT_OTHER): Payer: Managed Care, Other (non HMO)

## 2012-03-26 ENCOUNTER — Other Ambulatory Visit (HOSPITAL_BASED_OUTPATIENT_CLINIC_OR_DEPARTMENT_OTHER): Payer: Managed Care, Other (non HMO) | Admitting: Lab

## 2012-03-26 ENCOUNTER — Other Ambulatory Visit: Payer: Managed Care, Other (non HMO) | Admitting: Lab

## 2012-03-26 ENCOUNTER — Ambulatory Visit (HOSPITAL_BASED_OUTPATIENT_CLINIC_OR_DEPARTMENT_OTHER): Payer: Managed Care, Other (non HMO) | Admitting: Hematology & Oncology

## 2012-03-26 VITALS — BP 116/70 | HR 74 | Temp 96.8°F

## 2012-03-26 VITALS — BP 118/75 | HR 88 | Temp 97.6°F | Ht 69.0 in | Wt 191.0 lb

## 2012-03-26 DIAGNOSIS — C8595 Non-Hodgkin lymphoma, unspecified, lymph nodes of inguinal region and lower limb: Secondary | ICD-10-CM

## 2012-03-26 DIAGNOSIS — C8330 Diffuse large B-cell lymphoma, unspecified site: Secondary | ICD-10-CM

## 2012-03-26 DIAGNOSIS — Z5111 Encounter for antineoplastic chemotherapy: Secondary | ICD-10-CM

## 2012-03-26 DIAGNOSIS — Z5112 Encounter for antineoplastic immunotherapy: Secondary | ICD-10-CM

## 2012-03-26 DIAGNOSIS — R110 Nausea: Secondary | ICD-10-CM

## 2012-03-26 LAB — CBC WITH DIFFERENTIAL (CANCER CENTER ONLY)
BASO%: 3 % — ABNORMAL HIGH (ref 0.0–2.0)
LYMPH%: 40.2 % (ref 14.0–48.0)
MCH: 31.5 pg (ref 28.0–33.4)
MCHC: 35.8 g/dL (ref 32.0–35.9)
MCV: 88 fL (ref 82–98)
MONO%: 25.1 % — ABNORMAL HIGH (ref 0.0–13.0)
Platelets: 262 10*3/uL (ref 145–400)
RDW: 14.2 % (ref 11.1–15.7)

## 2012-03-26 LAB — COMPREHENSIVE METABOLIC PANEL
ALT: 52 U/L (ref 0–53)
Alkaline Phosphatase: 64 U/L (ref 39–117)
Glucose, Bld: 125 mg/dL — ABNORMAL HIGH (ref 70–99)
Sodium: 136 mEq/L (ref 135–145)
Total Bilirubin: 0.4 mg/dL (ref 0.3–1.2)
Total Protein: 6.5 g/dL (ref 6.0–8.3)

## 2012-03-26 LAB — TECHNOLOGIST REVIEW CHCC SATELLITE

## 2012-03-26 MED ORDER — SODIUM CHLORIDE 0.9 % IV SOLN
Freq: Once | INTRAVENOUS | Status: AC
  Administered 2012-03-26: 11:00:00 via INTRAVENOUS

## 2012-03-26 MED ORDER — VINCRISTINE SULFATE CHEMO INJECTION 1 MG/ML
2.0000 mg | Freq: Once | INTRAVENOUS | Status: AC
  Administered 2012-03-26: 2 mg via INTRAVENOUS
  Filled 2012-03-26: qty 2

## 2012-03-26 MED ORDER — SODIUM CHLORIDE 0.9 % IV SOLN
150.0000 mg | Freq: Once | INTRAVENOUS | Status: AC
  Administered 2012-03-26: 150 mg via INTRAVENOUS
  Filled 2012-03-26: qty 5

## 2012-03-26 MED ORDER — ACETAMINOPHEN 325 MG PO TABS
650.0000 mg | ORAL_TABLET | Freq: Once | ORAL | Status: AC
  Administered 2012-03-26: 650 mg via ORAL

## 2012-03-26 MED ORDER — DIPHENHYDRAMINE HCL 25 MG PO CAPS
50.0000 mg | ORAL_CAPSULE | Freq: Once | ORAL | Status: AC
Start: 2012-03-26 — End: 2012-03-26
  Administered 2012-03-26: 50 mg via ORAL

## 2012-03-26 MED ORDER — SODIUM CHLORIDE 0.9 % IV SOLN
750.0000 mg/m2 | Freq: Once | INTRAVENOUS | Status: AC
  Administered 2012-03-26: 1520 mg via INTRAVENOUS
  Filled 2012-03-26: qty 76

## 2012-03-26 MED ORDER — DOXORUBICIN HCL CHEMO IV INJECTION 2 MG/ML
50.0000 mg/m2 | Freq: Once | INTRAVENOUS | Status: AC
  Administered 2012-03-26: 102 mg via INTRAVENOUS
  Filled 2012-03-26: qty 51

## 2012-03-26 MED ORDER — PALONOSETRON HCL INJECTION 0.25 MG/5ML
0.2500 mg | Freq: Once | INTRAVENOUS | Status: AC
  Administered 2012-03-26: 0.25 mg via INTRAVENOUS

## 2012-03-26 MED ORDER — DEXAMETHASONE SODIUM PHOSPHATE 4 MG/ML IJ SOLN
12.0000 mg | Freq: Once | INTRAMUSCULAR | Status: AC
  Administered 2012-03-26: 12 mg via INTRAVENOUS

## 2012-03-26 MED ORDER — SODIUM CHLORIDE 0.9 % IV SOLN
375.0000 mg/m2 | Freq: Once | INTRAVENOUS | Status: AC
  Administered 2012-03-26: 800 mg via INTRAVENOUS
  Filled 2012-03-26: qty 80

## 2012-03-26 NOTE — Patient Instructions (Addendum)
Rituximab injection What is this medicine? RITUXIMAB (ri TUX i mab) is a monoclonal antibody. This medicine changes the way the body's immune system works. It is used commonly to treat non-Hodgkin's lymphoma and other conditions. In cancer cells, this drug targets a specific protein within cancer cells and stops the cancer cells from growing. It is also used to treat rhuematoid arthritis (RA). In RA, this medicine slow the inflammatory process and help reduce joint pain and swelling. This medicine is often used with other cancer or arthritis medications. This medicine may be used for other purposes; ask your health care provider or pharmacist if you have questions. What should I tell my health care provider before I take this medicine? They need to know if you have any of these conditions: -blood disorders -heart disease -history of hepatitis B -infection (especially a virus infection such as chickenpox, cold sores, or herpes) -irregular heartbeat -kidney disease -lung or breathing disease, like asthma -lupus -an unusual or allergic reaction to rituximab, mouse proteins, other medicines, foods, dyes, or preservatives -pregnant or trying to get pregnant -breast-feeding How should I use this medicine? This medicine is for infusion into a vein. It is administered in a hospital or clinic by a specially trained health care professional. A special MedGuide will be given to you by the pharmacist with each prescription and refill. Be sure to read this information carefully each time. Talk to your pediatrician regarding the use of this medicine in children. This medicine is not approved for use in children. Overdosage: If you think you have taken too much of this medicine contact a poison control center or emergency room at once. NOTE: This medicine is only for you. Do not share this medicine with others. What if I miss a dose? It is important not to miss a dose. Call your doctor or health care  professional if you are unable to keep an appointment. What may interact with this medicine? -cisplatin -medicines for blood pressure -some other medicines for arthritis -vaccines This list may not describe all possible interactions. Give your health care provider a list of all the medicines, herbs, non-prescription drugs, or dietary supplements you use. Also tell them if you smoke, drink alcohol, or use illegal drugs. Some items may interact with your medicine. What should I watch for while using this medicine? Report any side effects that you notice during your treatment right away, such as changes in your breathing, fever, chills, dizziness or lightheadedness. These effects are more common with the first dose. Visit your prescriber or health care professional for checks on your progress. You will need to have regular blood work. Report any other side effects. The side effects of this medicine can continue after you finish your treatment. Continue your course of treatment even though you feel ill unless your doctor tells you to stop. Call your doctor or health care professional for advice if you get a fever, chills or sore throat, or other symptoms of a cold or flu. Do not treat yourself. This drug decreases your body's ability to fight infections. Try to avoid being around people who are sick. This medicine may increase your risk to bruise or bleed. Call your doctor or health care professional if you notice any unusual bleeding. Be careful brushing and flossing your teeth or using a toothpick because you may get an infection or bleed more easily. If you have any dental work done, tell your dentist you are receiving this medicine. Avoid taking products that contain aspirin, acetaminophen,   ibuprofen, naproxen, or ketoprofen unless instructed by your doctor. These medicines may hide a fever. Do not become pregnant while taking this medicine. Women should inform their doctor if they wish to become  pregnant or think they might be pregnant. There is a potential for serious side effects to an unborn child. Talk to your health care professional or pharmacist for more information. Do not breast-feed an infant while taking this medicine. What side effects may I notice from receiving this medicine? Side effects that you should report to your doctor or health care professional as soon as possible: -allergic reactions like skin rash, itching or hives, swelling of the face, lips, or tongue -low blood counts - this medicine may decrease the number of white blood cells, red blood cells and platelets. You may be at increased risk for infections and bleeding. -signs of infection - fever or chills, cough, sore throat, pain or difficulty passing urine -signs of decreased platelets or bleeding - bruising, pinpoint red spots on the skin, black, tarry stools, blood in the urine -signs of decreased red blood cells - unusually weak or tired, fainting spells, lightheadedness -breathing problems -confused, not responsive -chest pain -fast, irregular heartbeat -feeling faint or lightheaded, falls -mouth sores -redness, blistering, peeling or loosening of the skin, including inside the mouth -stomach pain -swelling of the ankles, feet, or hands -trouble passing urine or change in the amount of urine Side effects that usually do not require medical attention (report to your doctor or other health care professional if they continue or are bothersome): -anxiety -headache -loss of appetite -muscle aches -nausea -night sweats This list may not describe all possible side effects. Call your doctor for medical advice about side effects. You may report side effects to FDA at 1-800-FDA-1088. Where should I keep my medicine? This drug is given in a hospital or clinic and will not be stored at home. NOTE: This sheet is a summary. It may not cover all possible information. If you have questions about this medicine,  talk to your doctor, pharmacist, or health care provider.  2012, [employer]. (07/03/2008 2:04:59 PM)Cyclophosphamide injection What is this medicine? CYCLOPHOSPHAMIDE (sye kloe FOSS fa mide) is a chemotherapy drug. It slows the growth of cancer cells. This medicine is used to treat many types of cancer like lymphoma, myeloma, leukemia, breast cancer, and ovarian cancer, to name a few. It is also used to treat nephrotic syndrome in children. This medicine may be used for other purposes; ask your health care provider or pharmacist if you have questions. What should I tell my health care provider before I take this medicine? They need to know if you have any of these conditions: -blood disorders -history of other chemotherapy -history of radiation therapy -infection -kidney disease -liver disease -tumors in the bone marrow -an unusual or allergic reaction to cyclophosphamide, other chemotherapy, other medicines, foods, dyes, or preservatives -pregnant or trying to get pregnant -breast-feeding How should I use this medicine? This drug is usually given as an injection into a vein or muscle or by infusion into a vein. It is administered in a hospital or clinic by a specially trained health care professional. Talk to your pediatrician regarding the use of this medicine in children. While this drug may be prescribed for selected conditions, precautions do apply. Overdosage: If you think you have taken too much of this medicine contact a poison control center or emergency room at once. NOTE: This medicine is only for you. Do not share this medicine  with others. What if I miss a dose? It is important not to miss your dose. Call your doctor or health care professional if you are unable to keep an appointment. What may interact with this medicine? Do not take this medicine with any of the following medications: -mibefradil -nalidixic acid This medicine may also interact with the following  medications: -doxorubicin -etanercept -medicines to increase blood counts like filgrastim, pegfilgrastim, sargramostim -medicines that block muscle or nerve pain -St. John's Wort -phenobarbital -succinylcholine chloride -trastuzumab -vaccines Talk to your doctor or health care professional before taking any of these medicines: -acetaminophen -aspirin -ibuprofen -ketoprofen -naproxen This list may not describe all possible interactions. Give your health care provider a list of all the medicines, herbs, non-prescription drugs, or dietary supplements you use. Also tell them if you smoke, drink alcohol, or use illegal drugs. Some items may interact with your medicine. What should I watch for while using this medicine? Visit your doctor for checks on your progress. This drug may make you feel generally unwell. This is not uncommon, as chemotherapy can affect healthy cells as well as cancer cells. Report any side effects. Continue your course of treatment even though you feel ill unless your doctor tells you to stop. Drink water or other fluids as directed. Urinate often, even at night. In some cases, you may be given additional medicines to help with side effects. Follow all directions for their use. Call your doctor or health care professional for advice if you get a fever, chills or sore throat, or other symptoms of a cold or flu. Do not treat yourself. This drug decreases your body's ability to fight infections. Try to avoid being around people who are sick. This medicine may increase your risk to bruise or bleed. Call your doctor or health care professional if you notice any unusual bleeding. Be careful brushing and flossing your teeth or using a toothpick because you may get an infection or bleed more easily. If you have any dental work done, tell your dentist you are receiving this medicine. Avoid taking products that contain aspirin, acetaminophen, ibuprofen, naproxen, or ketoprofen unless  instructed by your doctor. These medicines may hide a fever. Do not become pregnant while taking this medicine. Women should inform their doctor if they wish to become pregnant or think they might be pregnant. There is a potential for serious side effects to an unborn child. Talk to your health care professional or pharmacist for more information. Do not breast-feed an infant while taking this medicine. Men should inform their doctor if they wish to father a child. This medicine may lower sperm counts. If you are going to have surgery, tell your doctor or health care professional that you have taken this medicine. What side effects may I notice from receiving this medicine? Side effects that you should report to your doctor or health care professional as soon as possible: -allergic reactions like skin rash, itching or hives, swelling of the face, lips, or tongue -low blood counts - this medicine may decrease the number of white blood cells, red blood cells and platelets. You may be at increased risk for infections and bleeding. -signs of infection - fever or chills, cough, sore throat, pain or difficulty passing urine -signs of decreased platelets or bleeding - bruising, pinpoint red spots on the skin, black, tarry stools, blood in the urine -signs of decreased red blood cells - unusually weak or tired, fainting spells, lightheadedness -breathing problems -dark urine -mouth sores -pain, swelling, redness  at site where injected -swelling of the ankles, feet, hands -trouble passing urine or change in the amount of urine -weight gain -yellowing of the eyes or skin Side effects that usually do not require medical attention (report to your doctor or health care professional if they continue or are bothersome): -changes in nail or skin color -diarrhea -hair loss -loss of appetite -missed menstrual periods -nausea, vomiting -stomach pain This list may not describe all possible side effects. Call  your doctor for medical advice about side effects. You may report side effects to FDA at 1-800-FDA-1088. Where should I keep my medicine? This drug is given in a hospital or clinic and will not be stored at home. NOTE: This sheet is a summary. It may not cover all possible information. If you have questions about this medicine, talk to your doctor, pharmacist, or health care provider.  2012, [employer] Standard. (02/08/2008 2:32:25 PM)Vincristine injection What is this medicine? VINCRISTINE (vin KRIS teen) is a chemotherapy drug. It slows the growth of cancer cells. This medicine is used to treat many types of cancer like Hodgkin's disease, leukemia, non-Hodgkin's lymphoma, neuroblastoma (brain cancer), rhabdomyosarcoma, and Wilms' tumor. This medicine may be used for other purposes; ask your health care provider or pharmacist if you have questions. What should I tell my health care provider before I take this medicine? They need to know if you have any of these conditions: -blood disorders -gout -infection (especially chickenpox, cold sores, or herpes) -kidney disease -liver disease -lung disease -nervous system disease like Charcot-Marie-Tooth (CMT) -recent or ongoing radiation therapy -an unusual or allergic reaction to vincristine, other chemotherapy agents, other medicines, foods, dyes, or preservatives -pregnant or trying to get pregnant -breast-feeding How should I use this medicine? This drug is given as an infusion into a vein. It is administered in a hospital or clinic by a specially trained health care professional. If you have pain, swelling, burning, or any unusual feeling around the site of your injection, tell your health care professional right away. Talk to your pediatrician regarding the use of this medicine in children. While this drug may be prescribed for selected conditions, precautions do apply. Overdosage: If you think you have taken too much of this medicine  contact a poison control center or emergency room at once. NOTE: This medicine is only for you. Do not share this medicine with others. What if I miss a dose? It is important not to miss your dose. Call your doctor or health care professional if you are unable to keep an appointment. What may interact with this medicine? Do not take this medicine with any of the following medications: -itraconazole -mibefradil -voriconazole This medicine may also interact with the following medications: -cyclosporine -erythromycin -fluconazole -ketoconazole -medicines for HIV like delavirdine, efavirenz, nevirapine -medicines for seizures like ethotoin, fosphenotoin, phenytoin -medicines to increase blood counts like filgrastim, pegfilgrastim, sargramostim -other chemotherapy drugs like cisplatin, L-asparaginase, methotrexate, mitomycin, paclitaxel -pegaspargase -vaccines -zalcitabine, ddC Talk to your doctor or health care professional before taking any of these medicines: -acetaminophen -aspirin -ibuprofen -ketoprofen -naproxen This list may not describe all possible interactions. Give your health care provider a list of all the medicines, herbs, non-prescription drugs, or dietary supplements you use. Also tell them if you smoke, drink alcohol, or use illegal drugs. Some items may interact with your medicine. What should I watch for while using this medicine? Your condition will be monitored carefully while you are receiving this medicine. You will need important blood work done while you  are taking this medicine. This drug may make you feel generally unwell. This is not uncommon, as chemotherapy can affect healthy cells as well as cancer cells. Report any side effects. Continue your course of treatment even though you feel ill unless your doctor tells you to stop. In some cases, you may be given additional medicines to help with side effects. Follow all directions for their use. Call your doctor or  health care professional for advice if you get a fever, chills or sore throat, or other symptoms of a cold or flu. Do not treat yourself. Avoid taking products that contain aspirin, acetaminophen, ibuprofen, naproxen, or ketoprofen unless instructed by your doctor. These medicines may hide a fever. Do not become pregnant while taking this medicine. Women should inform their doctor if they wish to become pregnant or think they might be pregnant. There is a potential for serious side effects to an unborn child. Talk to your health care professional or pharmacist for more information. Do not breast-feed an infant while taking this medicine. Men may have a lower sperm count while taking this medicine. Talk to your doctor if you plan to father a child. What side effects may I notice from receiving this medicine? Side effects that you should report to your doctor or health care professional as soon as possible: -allergic reactions like skin rash, itching or hives, swelling of the face, lips, or tongue -breathing problems -confusion or changes in emotions or moods -constipation -cough -mouth sores -muscle weakness -nausea and vomiting -pain, swelling, redness or irritation at the injection site -pain, tingling, numbness in the hands or feet -problems with balance, talking, walking -seizures -stomach pain -trouble passing urine or change in the amount of urine Side effects that usually do not require medical attention (report to your doctor or health care professional if they continue or are bothersome): -diarrhea -hair loss -jaw pain -loss of appetite This list may not describe all possible side effects. Call your doctor for medical advice about side effects. You may report side effects to FDA at 1-800-FDA-1088. Where should I keep my medicine? This drug is given in a hospital or clinic and will not be stored at home. NOTE: This sheet is a summary. It may not cover all possible information. If  you have questions about this medicine, talk to your doctor, pharmacist, or health care provider.  2012, [employer]. (07/31/2008 5:17:13 PM)Doxorubicin injection What is this medicine? DOXORUBICIN (dox oh ROO bi sin) is a chemotherapy drug. It is used to treat many kinds of cancer like Hodgkin's disease, leukemia, non-Hodgkin's lymphoma, neuroblastoma, sarcoma, and Wilms' tumor. It is also used to treat bladder cancer, breast cancer, lung cancer, ovarian cancer, stomach cancer, and thyroid cancer. This medicine may be used for other purposes; ask your health care provider or pharmacist if you have questions. What should I tell my health care provider before I take this medicine? They need to know if you have any of these conditions: -blood disorders -heart disease, recent heart attack -infection (especially a virus infection such as chickenpox, cold sores, or herpes) -irregular heartbeat -liver disease -recent or ongoing radiation therapy -an unusual or allergic reaction to doxorubicin, other chemotherapy agents, other medicines, foods, dyes, or preservatives -pregnant or trying to get pregnant -breast-feeding How should I use this medicine? This drug is given as an infusion into a vein. It is administered in a hospital or clinic by a specially trained health care professional. If you have pain, swelling, burning or any unusual  feeling around the site of your injection, tell your health care professional right away. Talk to your pediatrician regarding the use of this medicine in children. Special care may be needed. Overdosage: If you think you have taken too much of this medicine contact a poison control center or emergency room at once. NOTE: This medicine is only for you. Do not share this medicine with others. What if I miss a dose? It is important not to miss your dose. Call your doctor or health care professional if you are unable to keep an appointment. What may interact  with this medicine? Do not take this medicine with any of the following medications: -cisapride -droperidol -halofantrine -pimozide -zidovudine This medicine may also interact with the following medications: -chloroquine -chlorpromazine -clarithromycin -cyclophosphamide -cyclosporine -erythromycin -medicines for depression, anxiety, or psychotic disturbances -medicines for irregular heart beat like amiodarone, bepridil, dofetilide, encainide, flecainide, propafenone, quinidine -medicines for seizures like ethotoin, fosphenytoin, phenytoin -medicines for nausea, vomiting like dolasetron, ondansetron, palonosetron -medicines to increase blood counts like filgrastim, pegfilgrastim, sargramostim -methadone -methotrexate -pentamidine -progesterone -vaccines -verapamil Talk to your doctor or health care professional before taking any of these medicines: -acetaminophen -aspirin -ibuprofen -ketoprofen -naproxen This list may not describe all possible interactions. Give your health care provider a list of all the medicines, herbs, non-prescription drugs, or dietary supplements you use. Also tell them if you smoke, drink alcohol, or use illegal drugs. Some items may interact with your medicine. What should I watch for while using this medicine? Your condition will be monitored carefully while you are receiving this medicine. You will need important blood work done while you are taking this medicine. This drug may make you feel generally unwell. This is not uncommon, as chemotherapy can affect healthy cells as well as cancer cells. Report any side effects. Continue your course of treatment even though you feel ill unless your doctor tells you to stop. Your urine may turn red for a few days after your dose. This is not blood. If your urine is dark or brown, call your doctor. In some cases, you may be given additional medicines to help with side effects. Follow all directions for their  use. Call your doctor or health care professional for advice if you get a fever, chills or sore throat, or other symptoms of a cold or flu. Do not treat yourself. This drug decreases your body's ability to fight infections. Try to avoid being around people who are sick. This medicine may increase your risk to bruise or bleed. Call your doctor or health care professional if you notice any unusual bleeding. Be careful brushing and flossing your teeth or using a toothpick because you may get an infection or bleed more easily. If you have any dental work done, tell your dentist you are receiving this medicine. Avoid taking products that contain aspirin, acetaminophen, ibuprofen, naproxen, or ketoprofen unless instructed by your doctor. These medicines may hide a fever. Men and women of childbearing age should use effective birth control methods while using taking this medicine. Do not become pregnant while taking this medicine. There is a potential for serious side effects to an unborn child. Talk to your health care professional or pharmacist for more information. Do not breast-feed an infant while taking this medicine. Do not let others touch your urine or other body fluids for 5 days after each treatment with this medicine. Caregivers should wear latex gloves to avoid touching body fluids during this time. What side effects may I notice from  receiving this medicine? Side effects that you should report to your doctor or health care professional as soon as possible: -allergic reactions like skin rash, itching or hives, swelling of the face, lips, or tongue -low blood counts - this medicine may decrease the number of white blood cells, red blood cells and platelets. You may be at increased risk for infections and bleeding. -signs of infection - fever or chills, cough, sore throat, pain or difficulty passing urine -signs of decreased platelets or bleeding - bruising, pinpoint red spots on the skin, black,  tarry stools, blood in the urine -signs of decreased red blood cells - unusually weak or tired, fainting spells, lightheadedness -breathing problems -chest pain -fast, irregular heartbeat -mouth sores -nausea, vomiting -pain, swelling, redness at site where injected -pain, tingling, numbness in the hands or feet -swelling of ankles, feet, or hands -unusual bleeding or bruising Side effects that usually do not require medical attention (report to your doctor or health care professional if they continue or are bothersome): -diarrhea -facial flushing -hair loss -loss of appetite -missed menstrual periods -nail discoloration or damage -red or watery eyes -red colored urine -stomach upset This list may not describe all possible side effects. Call your doctor for medical advice about side effects. You may report side effects to FDA at 1-800-FDA-1088. Where should I keep my medicine? This drug is given in a hospital or clinic and will not be stored at home. NOTE: This sheet is a summary. It may not cover all possible information. If you have questions about this medicine, talk to your doctor, pharmacist, or health care provider.  2012, Elsevier/Gold Standard. (02/22/2008 5:07:32 PM)Ondansetron tablets What is this medicine? ONDANSETRON (on DAN se tron) is used to treat nausea and vomiting caused by chemotherapy. It is also used to prevent or treat nausea and vomiting after surgery. This medicine may be used for other purposes; ask your health care provider or pharmacist if you have questions. What should I tell my health care provider before I take this medicine? They need to know if you have any of these conditions: -heart disease -history of irregular heartbeat -liver disease -low levels of magnesium or potassium in the blood -an unusual or allergic reaction to ondansetron, granisetron, other medicines, foods, dyes, or preservatives -pregnant or trying to get  pregnant -breast-feeding How should I use this medicine? Take this medicine by mouth with a glass of water. Follow the directions on your prescription label. Take your doses at regular intervals. Do not take your medicine more often than directed. Talk to your pediatrician regarding the use of this medicine in children. Special care may be needed. Overdosage: If you think you have taken too much of this medicine contact a poison control center or emergency room at once. NOTE: This medicine is only for you. Do not share this medicine with others. What if I miss a dose? If you miss a dose, take it as soon as you can. If it is almost time for your next dose, take only that dose. Do not take double or extra doses. What may interact with this medicine? Do not take this medicine with any of the following medications: -apomorphine  This medicine may also interact with the following medications: -carbamazepine -phenytoin -rifampicin -tramadol This list may not describe all possible interactions. Give your health care provider a list of all the medicines, herbs, non-prescription drugs, or dietary supplements you use. Also tell them if you smoke, drink alcohol, or use illegal drugs. Some items  may interact with your medicine. What should I watch for while using this medicine? Check with your doctor or health care professional right away if you have any sign of an allergic reaction. What side effects may I notice from receiving this medicine? Side effects that you should report to your doctor or health care professional as soon as possible: -allergic reactions like skin rash, itching or hives, swelling of the face, lips or tongue -breathing problems -dizziness -fast or irregular heartbeat -feeling faint or lightheaded, falls -fever and chills -swelling of the hands or feet -tightness in the chest Side effects that usually do not require medical attention (report to your doctor or health care  professional if they continue or are bothersome): -constipation or diarrhea -headache This list may not describe all possible side effects. Call your doctor for medical advice about side effects. You may report side effects to FDA at 1-800-FDA-1088. Where should I keep my medicine? Keep out of the reach of children. Store between 2 and 30 degrees C (36 and 86 degrees F). Throw away any unused medicine after the expiration date. NOTE: This sheet is a summary. It may not cover all possible information. If you have questions about this medicine, talk to your doctor, pharmacist, or health care provider.  2012, Elsevier/Gold Standard. (08/09/2010 11:18:31 AM)

## 2012-03-26 NOTE — Progress Notes (Signed)
CC:   Lupita Raider, M.D. Jerilee Field, MD  DIAGNOSIS:  Diffuse large cell lymphoma, left testicle.  CURRENT THERAPY:  The patient is status post 5 cycles of R-CHOP.  INTERIM HISTORY:  Mr. Molina comes in for followup.  He is in for his sixth and final cycle of chemotherapy.  With each cycle he has had a little more difficulty.  He is a little bit more nauseated.  He has had no emesis.  He has had no diarrhea.  There has been no cough.  He has had no fever.  There have been no mouth sores.  He has not noticed any leg swelling.  PHYSICAL EXAMINATION:  General:  This is a well-developed, well- nourished white gentleman in no obvious distress.  Vital signs:  97.6, pulse 88, respiratory rate 16, blood pressure 118/75.  Weight is 191. Head and neck:  Exam shows a normocephalic, atraumatic skull.  There are no ocular or oral lesions.  There are no palpable cervical, supraclavicular lymph nodes.  Lungs:  Clear bilaterally.  Cardiac: Regular rate and rhythm with a normal S1, S2.  There are no murmurs, rubs or bruits.  Abdomen:  Soft with good bowel sounds.  There is no palpable abdominal mass.  There is no palpable hepatosplenomegaly. Extremities:  Shows no clubbing, cyanosis or edema.  Neurological:  Exam shows no focal neurological deficits.  LABORATORY STUDIES:  White cell count is 3.3, hemoglobin 13.7, hematocrit 38.1, platelet count is 262.  IMPRESSION:  Mr. 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  He had a left orchiectomy. He is getting his systemic chemotherapy with R-CHOP.  We will go ahead and plan for sixth cycle today.  I want to plan for a PET scan in 3 weeks.  Will then go for intrathecal chemotherapy.  Hopefully, DepoCyt will be available.  If not, then we are going to have to go with methotrexate.  I do believe that intrathecal chemotherapy is going to be important for Mr. Kaneshiro given the nature of his  testicular lymphoma.  I still also feel that radiation therapy to the scrotal region will be indicated after the intrathecal therapy is completed.  I would probably set him for 6 cycles of the intrathecal therapy.    ______________________________ Josph Macho, M.D. PRE/MEDQ  D:  03/26/2012  T:  03/26/2012  Job:  [id number]

## 2012-03-26 NOTE — Progress Notes (Signed)
This office note has been dictated.

## 2012-03-27 ENCOUNTER — Ambulatory Visit: Payer: Managed Care, Other (non HMO)

## 2012-03-29 ENCOUNTER — Ambulatory Visit: Payer: Managed Care, Other (non HMO)

## 2012-04-16 ENCOUNTER — Encounter (HOSPITAL_COMMUNITY)
Admission: RE | Admit: 2012-04-16 | Discharge: 2012-04-16 | Disposition: A | Discharge status: Home / Self Care | Payer: Managed Care, Other (non HMO) | Source: Ambulatory Visit | Attending: Hematology & Oncology | Admitting: Hematology & Oncology

## 2012-04-16 DIAGNOSIS — Z9221 Personal history of antineoplastic chemotherapy: Secondary | ICD-10-CM | POA: Insufficient documentation

## 2012-04-16 DIAGNOSIS — C8589 Other specified types of non-Hodgkin lymphoma, extranodal and solid organ sites: Secondary | ICD-10-CM | POA: Insufficient documentation

## 2012-04-16 DIAGNOSIS — C8330 Diffuse large B-cell lymphoma, unspecified site: Secondary | ICD-10-CM

## 2012-04-16 MED ORDER — FLUDEOXYGLUCOSE F - 18 (FDG) INJECTION
16.5000 | Freq: Once | INTRAVENOUS | Status: AC | PRN
  Administered 2012-04-16: 16.5 via INTRAVENOUS

## 2012-04-19 LAB — GLUCOSE, CAPILLARY: Glucose-Capillary: 115 mg/dL — ABNORMAL HIGH (ref 70–99)

## 2012-04-20 ENCOUNTER — Ambulatory Visit (HOSPITAL_BASED_OUTPATIENT_CLINIC_OR_DEPARTMENT_OTHER): Payer: Managed Care, Other (non HMO) | Admitting: Hematology & Oncology

## 2012-04-20 ENCOUNTER — Other Ambulatory Visit (HOSPITAL_BASED_OUTPATIENT_CLINIC_OR_DEPARTMENT_OTHER): Payer: Managed Care, Other (non HMO) | Admitting: Lab

## 2012-04-20 VITALS — BP 135/88 | HR 84 | Temp 97.0°F | Ht 69.0 in | Wt 189.0 lb

## 2012-04-20 DIAGNOSIS — C8330 Diffuse large B-cell lymphoma, unspecified site: Secondary | ICD-10-CM

## 2012-04-20 DIAGNOSIS — C8595 Non-Hodgkin lymphoma, unspecified, lymph nodes of inguinal region and lower limb: Secondary | ICD-10-CM

## 2012-04-20 LAB — CBC WITH DIFFERENTIAL (CANCER CENTER ONLY)
BASO#: 0.2 10*3/uL (ref 0.0–0.2)
EOS%: 3.7 % (ref 0.0–7.0)
Eosinophils Absolute: 0.1 10*3/uL (ref 0.0–0.5)
HCT: 39.9 % (ref 38.7–49.9)
HGB: 13.8 g/dL (ref 13.0–17.1)
LYMPH%: 34.9 % (ref 14.0–48.0)
MCH: 31.2 pg (ref 28.0–33.4)
MCHC: 34.6 g/dL (ref 32.0–35.9)
MCV: 90 fL (ref 82–98)
MONO%: 23.9 % — ABNORMAL HIGH (ref 0.0–13.0)
NEUT#: 1.3 10*3/uL — ABNORMAL LOW (ref 1.5–6.5)
NEUT%: 33.3 % — ABNORMAL LOW (ref 40.0–80.0)
RBC: 4.43 10*6/uL (ref 4.20–5.70)

## 2012-04-20 LAB — COMPREHENSIVE METABOLIC PANEL
Alkaline Phosphatase: 59 U/L (ref 39–117)
BUN: 12 mg/dL (ref 6–23)
CO2: 27 mEq/L (ref 19–32)
Creatinine, Ser: 0.94 mg/dL (ref 0.50–1.35)
Glucose, Bld: 93 mg/dL (ref 70–99)
Total Bilirubin: 0.3 mg/dL (ref 0.3–1.2)
Total Protein: 6.8 g/dL (ref 6.0–8.3)

## 2012-04-20 LAB — TECHNOLOGIST REVIEW CHCC SATELLITE

## 2012-04-20 NOTE — Progress Notes (Signed)
CC:   Lupita Raider, M.D. Jerilee Field, MD  DIAGNOSIS:  Testicular lymphoma, diffuse large-cell.  CURRENT THERAPY: 1. The patient has completed 6 cycles of R-CHOP. 2. Patient to start prophylactic intrathecal DepoCyt in 2 weeks.  INTERIM HISTORY:  Mr. Doo comes in for his followup.  He completed his last cycle of chemotherapy back on May 10th.  He did very well with this.  His PET scan that was done on May 31st showed no evidence of residual lymphoma.  He did have a tough time with the last couple of cycles of chemotherapy. He likely was not going to take any more chemotherapy even if we reduced dose.  He is somewhat fatigued.  His taste for food is not what it used to be. He has had no nausea.  He has had no diarrhea.  He has had no rashes. He has had no headache.  Overall, his performance status is ECOG space 1.  PHYSICAL EXAMINATION:  General:  This is a well-developed, well- nourished white gentleman in no obvious distress.  Vital Signs:  Show a temperature of 97.6, pulse 84, respiratory rate 18, blood pressure 135/88, weight is 189.  Head and Neck Exam:  Shows a normocephalic, atraumatic skull.  There are no ocular or oral lesions.  There are no palpable cervical or supraclavicular lymph nodes.  Lungs:  Clear to percussion and auscultation bilaterally.  Cardiac Exam:  Regular rate and rhythm with a normal S1 and S2.  There are no murmurs, rubs, or bruits.  Abdominal Exam:  Soft with good bowel sounds.  There is no palpable abdominal mass.  There is no palpable hepatosplenomegaly.  Back Exam:  Shows no tenderness over the spine, ribs, or hips.  Extremities: Show no clubbing, cyanosis, or edema.  Skin Exam:  Shows no rashes, ecchymoses, or petechia.  LABORATORY STUDIES:  White cell count is 3.8, hemoglobin 13.8, hematocrit 39.9, platelet count 205.  MCV is 90.  IMPRESSION:  Mr. Mcclanahan is a 66 year old gentleman with large-cell lymphoma of the left testicle.  He did  undergo orchiectomy.  He currently has completed his systemic chemotherapy.  He is in remission.  The next "phase" will be prophylactic treatment of his CNS.  I think DepoCyt will be a good idea for him.  Of note, with his pretreatment spinal fluid, this was negative for lymphoma.  I believe 4 cycles of intrathecal chemotherapy would be appropriate for him.  I want to get started on June 17th.  Once the 4 cycles of DepoCyt are given, then we can move ahead and do radiation therapy to the scrotal region.  I will see Mr. Berkley back myself after he completes the 4 cycles of DepoCyt.  I am just so happy that Mr. Pietsch has done as well as he has done.    ______________________________ Josph Macho, M.D. PRE/MEDQ  D:  04/20/2012  T:  04/20/2012  Job:  [id number]

## 2012-04-20 NOTE — Progress Notes (Signed)
This office note has been dictated.

## 2012-05-04 ENCOUNTER — Ambulatory Visit (HOSPITAL_COMMUNITY)
Admission: RE | Admit: 2012-05-04 | Discharge: 2012-05-04 | Disposition: A | Discharge status: Home / Self Care | Payer: Managed Care, Other (non HMO) | Source: Ambulatory Visit | Attending: Hematology & Oncology | Admitting: Hematology & Oncology

## 2012-05-04 ENCOUNTER — Encounter (HOSPITAL_COMMUNITY): Payer: Self-pay

## 2012-05-04 ENCOUNTER — Other Ambulatory Visit: Payer: Self-pay | Admitting: Hematology & Oncology

## 2012-05-04 ENCOUNTER — Other Ambulatory Visit (HOSPITAL_COMMUNITY): Payer: Managed Care, Other (non HMO)

## 2012-05-04 DIAGNOSIS — C8330 Diffuse large B-cell lymphoma, unspecified site: Secondary | ICD-10-CM

## 2012-05-04 DIAGNOSIS — C8589 Other specified types of non-Hodgkin lymphoma, extranodal and solid organ sites: Secondary | ICD-10-CM | POA: Insufficient documentation

## 2012-05-04 HISTORY — DX: Unspecified injury of lower back, initial encounter: S39.92XA

## 2012-05-04 MED ORDER — HYDROCORTISONE SOD SUCCINATE 100 MG PF FOR IT USE
50.0000 mg | Freq: Once | INTRAMUSCULAR | Status: DC
  Administered 2012-05-04: 50 mg via INTRATHECAL
  Filled 2012-05-04: qty 0.5

## 2012-05-04 MED ORDER — ACETAMINOPHEN 500 MG PO TABS: 1000.0000 mg | ORAL_TABLET | Freq: Four times a day (QID) | ORAL | Status: DC | PRN

## 2012-05-04 MED ORDER — CYTARABINE LIPOSOME CHEMO INTRATHECAL 50 MG/5ML
50.0000 mg | Freq: Once | INTRATHECAL | Status: DC
  Administered 2012-05-04: 50 mg via INTRATHECAL
  Filled 2012-05-04: qty 5

## 2012-05-04 MED ORDER — PROCHLORPERAZINE MALEATE 10 MG PO TABS
10.0000 mg | ORAL_TABLET | Freq: Once | ORAL | Status: DC
  Administered 2012-05-04: 10 mg via ORAL
  Filled 2012-05-04: qty 1

## 2012-05-04 NOTE — Procedures (Signed)
Lumbar puncture in fluoroscopy at L23. Clear csf with OP 9cm water.  Clear csf.  Chemotherapy intrathecal injected without complication

## 2012-05-04 NOTE — Discharge Instructions (Signed)
No driving today. Lay flat at home. Up to Bathroom and to eat. Drink additional fluids x next 24 hours

## 2012-05-06 ENCOUNTER — Other Ambulatory Visit (HOSPITAL_COMMUNITY): Payer: Self-pay | Admitting: Radiology

## 2012-05-06 ENCOUNTER — Ambulatory Visit: Payer: Managed Care, Other (non HMO)

## 2012-05-06 ENCOUNTER — Telehealth: Payer: Self-pay | Admitting: *Deleted

## 2012-05-06 ENCOUNTER — Ambulatory Visit (HOSPITAL_BASED_OUTPATIENT_CLINIC_OR_DEPARTMENT_OTHER): Payer: Managed Care, Other (non HMO) | Admitting: Hematology & Oncology

## 2012-05-06 VITALS — BP 162/90 | HR 100 | Temp 98.9°F | Ht 69.0 in

## 2012-05-06 DIAGNOSIS — G971 Other reaction to spinal and lumbar puncture: Secondary | ICD-10-CM

## 2012-05-06 DIAGNOSIS — C8585 Other specified types of non-Hodgkin lymphoma, lymph nodes of inguinal region and lower limb: Secondary | ICD-10-CM

## 2012-05-06 DIAGNOSIS — R110 Nausea: Secondary | ICD-10-CM

## 2012-05-06 MED ORDER — PROMETHAZINE HCL 25 MG PO TABS: 12.5000 mg | ORAL_TABLET | Freq: Four times a day (QID) | ORAL | Status: DC | PRN

## 2012-05-06 NOTE — Progress Notes (Signed)
This office note has been dictated.

## 2012-05-06 NOTE — Telephone Encounter (Signed)
Pt called stating he was having "a reaction to the chemo injection in my spine".

## 2012-05-07 NOTE — Progress Notes (Signed)
DIAGNOSES: 1. 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  INTERIM HISTORY:  [name] comes in for an unscheduled visit.  He received his 1st of 4 intrathecal DepoCyt treatments Tuesday.  He completed all of his systemic chemotherapy for his testicular lymphoma. He is in remission.  He says he did well with the intrathecal injection.  He actually had this on the 18th.  He said he went to work the next day.  He had no problems then.  Today, however, he noticed more in the way of headache. He is having nausea.  He was having a lot of swelling about the eyes. He had no emesis.  He had no weakness that was asymmetric.  He had no double vision.  We told him to come into the office so that we could quickly evaluate him.  In the office, he actually looks quite good.  He does have some slight "puffiness" about the eyes.  His pupils react appropriately.  PHYSICAL EXAMINATION:  He is afebrile, temperature is 98.9.  Blood pressure is a little bit on the high side at 162/90.  Head and neck: Good range of motion in neck.  He has no neck stiffness.  He has no adenopathy in the neck or supraclavicular regions.  Lungs:  Clear. Cardiac:  Regular rate and rhythm with a normal S1, S2.  He has no murmurs, rubs or bruits.  Abdomen:  Soft.  He has good bowel sounds.  He has no palpable hepatosplenomegaly.  Extremities:  No clubbing, cyanosis or edema.  He moves all extremities spontaneously.  He has good strength that is symmetric bilaterally.  He has 4/4 strength in his arms and legs.  Neurological:  No focal neurological deficits.  IMPRESSION:  [name] is a 66 year old gentleman with testicular lymphoma of the left testicle.  He underwent orchiectomy.  He had 6 cycles of R-CHOP.  He is in remission.  I do not think it is that unusual for him to have these symptoms.  He looks better than I would have thought.  I just do not see that he has any type of dural  inflammation.  I do not see any evidence of arachnoiditis.  I told [name] that he can certainly take Tylenol or Motrin for the headache.  If these do not help, I could give him something a little bit stronger (Ultram).  Hopefully, he will not have similar symptoms with his 2nd treatment.  If these symptoms persist, we could try gabapentin.  There are a couple of encouraging reports using gabapentin (400 mg p.o. t.i.d.) for post dural puncture headache.    ______________________________ Josph Macho, M.D. PRE/MEDQ  D:  05/06/2012  T:  05/07/2012  Job:  [id number]

## 2012-05-18 ENCOUNTER — Other Ambulatory Visit (HOSPITAL_COMMUNITY): Payer: Managed Care, Other (non HMO)

## 2012-05-18 ENCOUNTER — Ambulatory Visit (HOSPITAL_COMMUNITY): Admission: RE | Admit: 2012-05-18 | Payer: Managed Care, Other (non HMO) | Source: Ambulatory Visit

## 2012-06-01 ENCOUNTER — Other Ambulatory Visit (HOSPITAL_COMMUNITY): Payer: Managed Care, Other (non HMO)

## 2012-06-01 ENCOUNTER — Ambulatory Visit (HOSPITAL_COMMUNITY): Payer: Managed Care, Other (non HMO)

## 2012-06-15 ENCOUNTER — Other Ambulatory Visit (HOSPITAL_COMMUNITY): Payer: Managed Care, Other (non HMO)

## 2012-06-15 ENCOUNTER — Inpatient Hospital Stay (HOSPITAL_COMMUNITY): Admission: RE | Admit: 2012-06-15 | Payer: Managed Care, Other (non HMO) | Source: Ambulatory Visit

## 2012-06-21 ENCOUNTER — Other Ambulatory Visit (HOSPITAL_BASED_OUTPATIENT_CLINIC_OR_DEPARTMENT_OTHER): Payer: Managed Care, Other (non HMO) | Admitting: Lab

## 2012-06-21 ENCOUNTER — Ambulatory Visit (HOSPITAL_BASED_OUTPATIENT_CLINIC_OR_DEPARTMENT_OTHER): Payer: Managed Care, Other (non HMO) | Admitting: Hematology & Oncology

## 2012-06-21 VITALS — BP 123/80 | HR 87 | Temp 97.1°F | Resp 18 | Ht 69.0 in | Wt 190.0 lb

## 2012-06-21 DIAGNOSIS — C8330 Diffuse large B-cell lymphoma, unspecified site: Secondary | ICD-10-CM

## 2012-06-21 DIAGNOSIS — C8585 Other specified types of non-Hodgkin lymphoma, lymph nodes of inguinal region and lower limb: Secondary | ICD-10-CM

## 2012-06-21 DIAGNOSIS — R5383 Other fatigue: Secondary | ICD-10-CM

## 2012-06-21 LAB — CBC WITH DIFFERENTIAL (CANCER CENTER ONLY)
BASO%: 2 % (ref 0.0–2.0)
Eosinophils Absolute: 0.2 10*3/uL (ref 0.0–0.5)
HCT: 42.2 % (ref 38.7–49.9)
LYMPH#: 1.7 10*3/uL (ref 0.9–3.3)
LYMPH%: 41.7 % (ref 14.0–48.0)
MCV: 89 fL (ref 82–98)
MONO#: 0.8 10*3/uL (ref 0.1–0.9)
NEUT%: 32.5 % — ABNORMAL LOW (ref 40.0–80.0)
RBC: 4.74 10*6/uL (ref 4.20–5.70)
RDW: 13.6 % (ref 11.1–15.7)
WBC: 4.1 10*3/uL (ref 4.0–10.0)

## 2012-06-21 LAB — COMPREHENSIVE METABOLIC PANEL
ALT: 61 U/L — ABNORMAL HIGH (ref 0–53)
Albumin: 4.3 g/dL (ref 3.5–5.2)
CO2: 28 mEq/L (ref 19–32)
Chloride: 102 mEq/L (ref 96–112)
Glucose, Bld: 96 mg/dL (ref 70–99)
Sodium: 139 mEq/L (ref 135–145)

## 2012-06-22 ENCOUNTER — Encounter: Payer: Self-pay | Admitting: *Deleted

## 2012-06-22 DIAGNOSIS — C801 Malignant (primary) neoplasm, unspecified: Secondary | ICD-10-CM | POA: Insufficient documentation

## 2012-06-22 NOTE — Progress Notes (Signed)
This office note has been dictated.

## 2012-06-22 NOTE — Progress Notes (Signed)
CC:   Lupita Raider, M.D.  DIAGNOSIS:  Testicular lymphoma (diffuse large cell)  CURRENT THERAPY: 1. The patient is status post 6 cycles of R-CHOP. 2. The patient received 2 intrathecal deficit treatments.  INTERIM HISTORY:  Mr. Kia came in for followup.  He really had a tough time with the intrathecal therapy.  He just had complications.  He had a lot of problems headache-wise.  As such, he did not want to do 4r treatments.  He still has some weakness.  He has had occasional headaches.  He does get tired quite easily.  There has been no change in bowel or bladder habits.  He has had no cough.  He has had no double vision or blurred vision.  He has had no leg swelling.  Of note, his last PET scan which he did after completion of his treatments showed no evidence of active lymphoma.  PHYSICAL EXAMINATION:  General:  This is a well-developed, well- nourished, white gentleman in no obvious distress.  Vital Signs:  97, pulse 87, respiratory rate 18, blood pressure 123/80.  Weight is 190. Head and Neck:  Normocephalic and atraumatic skull.  There are no ocular or oral lesions.  There are no palpable cervical or supraclavicular lymph nodes.  Lungs:  Clear to percussion and auscultation bilaterally. Cardiac:  Regular rate and rhythm with a normal S1 and S2.  There are no murmurs, rubs, or bruits.  Abdomen:  Soft with good bowel sounds.  There is no palpable abdominal mass.  There is no palpable hepatosplenomegaly. He does have well healed orchiectomy scar.  Extremities:  No clubbing, cyanosis, or edema.  Back:  No tenderness over the spine, ribs, or hips. Neurologic:  No focal neurological deficits.  LABORATORY STUDIES:  White cell count is 4.1, hemoglobin 14.4, hematocrit 2.2, platelet count 163.  LDH is 174.  BUN is 10, creatinine 0.9.  IMPRESSION:  Mr. Malizia is a 66 year old gentleman with testicular large cell lymphoma.  He underwent orchiectomy back in  January.  Apparently he went into remission with systemic chemotherapy with R- CHOP.  We then went ahead and did prophylactic intrathecal chemo. Again, he had a hard time with this so we cannot complete all 4 cycles.  I do believe that radiation therapy to the scrotum is indicated now.  I spoke with Dr. Karoline Caldwell of Radiation Oncology.  She will make an appointment for him to be seen.  We will plan to get Mr. Linnemann back to see [place] in about 6 weeks' time.  I do not see that we need to do any scans on him given that he is asymptomatic.    ______________________________ Josph Macho, M.D. PRE/MEDQ  D:  06/22/2012  T:  06/22/2012  Job:  [id number]

## 2012-06-23 ENCOUNTER — Ambulatory Visit: Payer: Managed Care, Other (non HMO)

## 2012-06-23 ENCOUNTER — Ambulatory Visit: Payer: Managed Care, Other (non HMO) | Admitting: Radiation Oncology

## 2012-06-25 ENCOUNTER — Ambulatory Visit: Payer: Managed Care, Other (non HMO) | Admitting: Hematology & Oncology

## 2012-06-25 ENCOUNTER — Other Ambulatory Visit: Payer: Managed Care, Other (non HMO) | Admitting: Lab

## 2012-06-28 ENCOUNTER — Encounter: Payer: Self-pay | Admitting: Radiation Oncology

## 2012-06-28 ENCOUNTER — Ambulatory Visit
Admission: RE | Admit: 2012-06-28 | Discharge: 2012-06-28 | Disposition: A | Discharge status: Home / Self Care | Payer: Managed Care, Other (non HMO) | Source: Ambulatory Visit | Attending: Radiation Oncology | Admitting: Radiation Oncology

## 2012-06-28 VITALS — BP 130/79 | HR 89 | Temp 97.7°F | Resp 20 | Ht 70.0 in | Wt 192.4 lb

## 2012-06-28 DIAGNOSIS — Z7982 Long term (current) use of aspirin: Secondary | ICD-10-CM | POA: Insufficient documentation

## 2012-06-28 DIAGNOSIS — R197 Diarrhea, unspecified: Secondary | ICD-10-CM | POA: Insufficient documentation

## 2012-06-28 DIAGNOSIS — C8330 Diffuse large B-cell lymphoma, unspecified site: Secondary | ICD-10-CM

## 2012-06-28 DIAGNOSIS — Z9221 Personal history of antineoplastic chemotherapy: Secondary | ICD-10-CM | POA: Insufficient documentation

## 2012-06-28 DIAGNOSIS — Z79899 Other long term (current) drug therapy: Secondary | ICD-10-CM | POA: Insufficient documentation

## 2012-06-28 DIAGNOSIS — D4959 Neoplasm of unspecified behavior of other genitourinary organ: Secondary | ICD-10-CM | POA: Insufficient documentation

## 2012-06-28 DIAGNOSIS — C8589 Other specified types of non-Hodgkin lymphoma, extranodal and solid organ sites: Secondary | ICD-10-CM | POA: Insufficient documentation

## 2012-06-28 DIAGNOSIS — L259 Unspecified contact dermatitis, unspecified cause: Secondary | ICD-10-CM | POA: Insufficient documentation

## 2012-06-28 DIAGNOSIS — Z87891 Personal history of nicotine dependence: Secondary | ICD-10-CM | POA: Insufficient documentation

## 2012-06-28 NOTE — Progress Notes (Addendum)
Pt denies pain, urinary/bowel issues, loss of appetite. He does experience intermittent fatigue and dizziness. He states dizziness began w/chemo and is resolving.  Pt was Medical laboratory scientific officer x 30 yrs.

## 2012-06-28 NOTE — Progress Notes (Signed)
Radiation Oncology         [phone]) [phone] ________________________________  Initial outpatient Consultation  Name: [name] MRN: [id number]  Date: 06/28/2012  DOB: 1946-02-02   REFERRING PHYSICIAN: Josph Macho, MD  DIAGNOSIS: The encounter diagnosis was Diffuse Large B-Cell Lymphoma. Stage IVE, testicular primary.  HISTORY OF PRESENT ILLNESS::[name] is a 66 y.o. male who  Developed swelling in his left testicle several months ago. He saw a urologist and underwent an ultrasound on 07-24-11  that confirmed an enlarged left testis.  Swelling did not improve with Cipro. MRI in December of the pelvis was suspicious for testicular neoplasm.   He ultimately underwent a left orchiectomy  on 12-02-11 that revealed Diffuse large B cell lymphoma.  Bone marrow biopsy was negative.   PET scan initially demonstrated  Retroperitoneal lymphadenopathy and hypermetabolic activity in the right clavicle suspicious for lymphoma.  Patient denies any trauma to that area.  He initiated chemotherapy with Dr. Myna Hidalgo in the form of R-CHOP x 6, and his PET scans in March and May demonstrated no residual metabolic  disease activity in the body.  He then  received prophylactic intrathecal  Depocyt x 1-2 (pt reports only 1 cycle, med/onc notes report 2). Could not tolerate 4 cycles as planned.  Had headaches, weakness, fatigue, and ultimately refused further treatment.  Today, he denies pain, GU or GI issues, or anorexia. He reports fatigue and occasional dizziness.  These symptoms are improving now that he is done with chemotherapy. The patient had right shoulder pain that improved after starting R-CHOP.  He chews tobacco -we discussed  risks and cessation and he is not interested in quitting. ECOG performance status is a 1.  PREVIOUS RADIATION THERAPY: No  PAST MEDICAL HISTORY:  has a past medical history of History of kidney stones; Testicular neoplasm (left); Diffuse Large B-Cell Lymphoma (11/17/2011);  Pinched cervical nerve root; Injury of lower back; Cancer (12/02/2011 dx); History of chemotherapy; BPH (benign prostatic hyperplasia); Orchitis; Chronic kidney disease; Pinched cervical nerve root; and Allergy.    PAST SURGICAL HISTORY: Past Surgical History  Procedure Date  . Cystoscopy/retrograde/ureteroscopy/stone extraction with basket 2004  . Testicle biopsy 10/31/2011    Procedure: BIOPSY TESTICULAR;  Surgeon: Antony Haste, MD;  Location: [facility];  Service: Urology;  Laterality: Left;  . Bone marrow aspirate and biopsy wiith lumbar puncture 11-27-2011    CT GUIDED  . Orchiectomy 12/02/2011    Procedure: ORCHIECTOMY;  Surgeon: Antony Haste, MD;  Location: [facility];  Service: Urology;  Laterality: Left;  left inguinal orchiectomy   . Lithotripsy     history    FAMILY HISTORY: family history includes Cancer in his brother and father.  SOCIAL HISTORY:  reports that he quit smoking about 17 years ago. His smoking use included Cigarettes. He has a 60 pack-year smoking history. His smokeless tobacco use includes Chew. He reports that he drinks alcohol. He reports that he does not use illicit drugs.  ALLERGIES: Morphine and related and Motrin  MEDICATIONS:  Current Outpatient Prescriptions  Medication Sig Dispense Refill  . aspirin 81 MG tablet Take 81 mg by mouth daily.      Marland Kitchen acetaminophen (TYLENOL) 325 MG tablet Take 650 mg by mouth every 6 (six) hours as needed.      . Alum & Mag Hydroxide-Simeth (MAGIC MOUTHWASH W/LIDOCAINE) SOLN Take 5 mLs by mouth 4 (four) times daily as needed.  300 mL  3  . ondansetron (ZOFRAN)  8 MG tablet Take 1 tablet two times a day starting the day after chemo for 3 days. Then take 1 tablet two times a day as needed for nausea or vomiting.  30 tablet  2  . predniSONE (DELTASONE) 20 MG tablet Take 20 mg by mouth. 01/23/2012 Take 3 tabs daily x 5 days, starting the day after chem. Teola Bradley, Ginger Johnson        . promethazine (PHENERGAN) 25 MG tablet Take 0.5 tablets (12.5 mg total) by mouth every 6 (six) hours as needed for nausea.  30 tablet  2  . DISCONTD: prochlorperazine (COMPAZINE) 10 MG tablet Take 1 tablet (10 mg total) by mouth every 6 (six) hours as needed (Nausea or vomiting).  30 tablet  6    REVIEW OF SYSTEMS: pertinent findings above   PHYSICAL EXAM:  height is 5\' 10"  (1.778 m) and weight is 192 lb 6.4 oz (87.272 kg). His oral temperature is 97.7 F (36.5 C). His blood pressure is 130/79 and his pulse is 89. His respiration is 20.   General: Alert and oriented, in no acute distress HEENT: Head is normocephalic. Pupils are equally round and reactive to light. Extraocular movements are intact. Oropharynx is clear. Neck: Neck is supple, no palpable cervical or supraclavicular lymphadenopathy. Heart: Regular in rate and rhythm  Chest: Clear to auscultation bilaterally Abdomen: Soft, nontender, nondistended, with no rigidity or guarding. Extremities: No cyanosis or edema. Lymphatics: No neck or inguinal LAD Skin: No concerning lesions. Musculoskeletal: symmetric strength and muscle tone throughout. Neurologic: Cranial nerves II through XII are grossly intact. No obvious focalities. Speech is fluent. Coordination is intact. Psychiatric: Judgment and insight are intact. Affect is appropriate. GU: No left testis. Right testis is without swelling or masses. Scrotum is unremarkable.    LABORATORY DATA:  Lab Results  Component Value Date   WBC 4.1 06/21/2012   HGB 14.8 06/21/2012   HCT 42.2 06/21/2012   MCV 89 06/21/2012   PLT 163 06/21/2012   CMP     Component Value Date/Time   NA 139 06/21/2012 0838   K 3.9 06/21/2012 0838   CL 102 06/21/2012 0838   CO2 28 06/21/2012 0838   GLUCOSE 96 06/21/2012 0838   BUN 10 06/21/2012 0838   CREATININE 0.90 06/21/2012 0838   CALCIUM 9.7 06/21/2012 0838   PROT 6.5 06/21/2012 0838   ALBUMIN 4.3 06/21/2012 0838   AST 38* 06/21/2012 0838   ALT 61* 06/21/2012 0838    ALKPHOS 82 06/21/2012 0838   BILITOT 0.4 06/21/2012 0838       RADIOGRAPHY: as above    IMPRESSION/PLAN: Dr. Myna Hidalgo and I agree that the patient likely had Stage IV NHL at diagnosis in light of his clavicular activity which resolved with chemotherapy.  He is without evidence of hypermetabolic disease on PET after chemotherapy.  Nevertheless, he is at about 35% risk of failing in the scrotal/testicular region.  This risk can reduce by ~3/4 with radiotherapy.  I would treat this region to 30.6 Gy/1.8 Gy/fraction.  I explained this to the patient and his wife.   It was a pleasure meeting the patient today. We discussed the risks, benefits, and side effects of radiotherapy.  He understands that he may need to go  on testosterone supplementation in the future.  I discussed this with Dr. Myna Hidalgo as well, and he is happy to follow the patient and treat his as indicated. I will order baseline testosterone labs on the patient prior to simulation. No guarantees  of treatment were given. A consent form was signed and placed in the patient's medical record. The patient is enthusiastic about proceeding with treatment. I look forward to participating in the patient's care.   -----------------------------------------------------------------  Buckner Malta, M.D.

## 2012-06-28 NOTE — Progress Notes (Signed)
Please see the Nurse Progress Note in the MD Initial Consult Encounter for this patient. 

## 2012-07-02 NOTE — Addendum Note (Signed)
Encounter addended by: Glennie Hawk, RN on: 07/02/2012 12:51 PM<BR>     Documentation filed: Charges VN

## 2012-07-14 ENCOUNTER — Ambulatory Visit: Payer: Managed Care, Other (non HMO) | Attending: Radiation Oncology

## 2012-07-14 ENCOUNTER — Ambulatory Visit: Payer: Managed Care, Other (non HMO) | Admitting: Radiation Oncology

## 2012-08-03 ENCOUNTER — Other Ambulatory Visit (HOSPITAL_BASED_OUTPATIENT_CLINIC_OR_DEPARTMENT_OTHER): Payer: Managed Care, Other (non HMO) | Admitting: Lab

## 2012-08-03 ENCOUNTER — Ambulatory Visit: Payer: Managed Care, Other (non HMO) | Admitting: Hematology & Oncology

## 2012-08-03 ENCOUNTER — Other Ambulatory Visit: Payer: Managed Care, Other (non HMO) | Admitting: Lab

## 2012-08-03 ENCOUNTER — Ambulatory Visit (HOSPITAL_BASED_OUTPATIENT_CLINIC_OR_DEPARTMENT_OTHER): Payer: Managed Care, Other (non HMO) | Admitting: Hematology & Oncology

## 2012-08-03 VITALS — BP 130/76 | HR 72 | Temp 97.7°F | Resp 20 | Ht 72.0 in | Wt 190.0 lb

## 2012-08-03 DIAGNOSIS — E291 Testicular hypofunction: Secondary | ICD-10-CM

## 2012-08-03 DIAGNOSIS — C8589 Other specified types of non-Hodgkin lymphoma, extranodal and solid organ sites: Secondary | ICD-10-CM

## 2012-08-03 DIAGNOSIS — M810 Age-related osteoporosis without current pathological fracture: Secondary | ICD-10-CM

## 2012-08-03 DIAGNOSIS — E349 Endocrine disorder, unspecified: Secondary | ICD-10-CM

## 2012-08-03 DIAGNOSIS — C8330 Diffuse large B-cell lymphoma, unspecified site: Secondary | ICD-10-CM

## 2012-08-03 DIAGNOSIS — C8595 Non-Hodgkin lymphoma, unspecified, lymph nodes of inguinal region and lower limb: Secondary | ICD-10-CM

## 2012-08-03 LAB — CBC WITH DIFFERENTIAL (CANCER CENTER ONLY)
BASO#: 0 10*3/uL (ref 0.0–0.2)
Eosinophils Absolute: 0.2 10*3/uL (ref 0.0–0.5)
HCT: 42.6 % (ref 38.7–49.9)
HGB: 15 g/dL (ref 13.0–17.1)
LYMPH%: 42.2 % (ref 14.0–48.0)
MCH: 31.3 pg (ref 28.0–33.4)
MCV: 89 fL (ref 82–98)
MONO%: 14 % — ABNORMAL HIGH (ref 0.0–13.0)
NEUT%: 35.5 % — ABNORMAL LOW (ref 40.0–80.0)
RBC: 4.8 10*6/uL (ref 4.20–5.70)

## 2012-08-03 LAB — COMPREHENSIVE METABOLIC PANEL
ALT: 64 U/L — ABNORMAL HIGH (ref 0–53)
AST: 33 U/L (ref 0–37)
Albumin: 4.3 g/dL (ref 3.5–5.2)
Calcium: 9.5 mg/dL (ref 8.4–10.5)
Chloride: 105 mEq/L (ref 96–112)
Creatinine, Ser: 0.87 mg/dL (ref 0.50–1.35)
Potassium: 3.9 mEq/L (ref 3.5–5.3)

## 2012-08-03 LAB — LACTATE DEHYDROGENASE: LDH: 147 U/L (ref 94–250)

## 2012-08-03 NOTE — Progress Notes (Signed)
This office note has been dictated.

## 2012-08-04 NOTE — Progress Notes (Signed)
CC:   Lupita Raider, M.D.  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  CURRENT THERAPY: 1. The patient to start scrotal radiation therapy. 2. The patient is status post 6 cycles of R-CHOP. 3. The patient is status post 1 intrathecal therapy.  INTERIM HISTORY:  Mr. Martis comes in for his followup.  He is doing okay.  He does have some diarrhea, on and off.  He has some occasional abdominal discomfort.  He has not started the scrotal radiation.  He saw Dr. Basilio Cairo of radiation oncology.  Hopefully, this will get started next week.  I talked to him about this.  I told to him about the possibility of him needing testosterone replacement.  We are checking a testosterone level on him.  He has had no cough.  He has had no fever.  He has had no sweats.  He has had no bony pain.  He has had no leg swelling.  He has had an occasional headache.  PHYSICAL EXAMINATION:  General:  This is a well-developed, well- nourished white gentleman in no obvious distress.  Vital signs:  97.7, pulse 72, respiratory rate 20, blood pressure 130/76.  Weight is 190. Head and neck:  Normocephalic, atraumatic skull.  There are no ocular or oral lesions.  There are no palpable cervical or supraclavicular lymph nodes.  Lungs:  Clear bilaterally.  Cardiac:  Regular rate and rhythm with a normal S1 and S2.  There are no murmurs, rubs or bruits. Abdomen:  Soft with good bowel sounds.  There is no palpable abdominal mass.  He has a well-healed left inguinal orchiectomy scar.  He has no fluid wave.  There is no palpable hepatosplenomegaly.  Back:  No tenderness over the spine, ribs or hips.  Extremities:  Shows no clubbing, cyanosis or edema.  LABORATORY STUDIES:  White cell count is 3.2, hemoglobin 15, hematocrit 43, platelet count 155.  IMPRESSION:  Mr. Buckalew is a 66 year old gentleman with testicular lymphoma.  We have treated him very aggressively.  He has had systemic chemotherapy.  We  tried intrathecal therapy but he tolerated this very, very poorly.  Hopefully, he will go through with scrotal radiation.  I reassured him that he would do well with this.  I also told him that without scrotal radiation that lymphoma could certainly come back.  Again, we will monitor his testosterone levels.  I will plan to see him back myself in another 2 months.  I do not see that we need to do a PET scan on him as of now.    ______________________________ Josph Macho, M.D. PRE/MEDQ  D:  08/03/2012  T:  08/03/2012  Job:  [id number]

## 2012-08-06 ENCOUNTER — Ambulatory Visit
Admission: RE | Admit: 2012-08-06 | Discharge: 2012-08-06 | Disposition: A | Discharge status: Home / Self Care | Payer: Managed Care, Other (non HMO) | Source: Ambulatory Visit | Attending: Radiation Oncology | Admitting: Radiation Oncology

## 2012-08-06 DIAGNOSIS — C8589 Other specified types of non-Hodgkin lymphoma, extranodal and solid organ sites: Secondary | ICD-10-CM

## 2012-08-06 NOTE — Progress Notes (Signed)
Complex Simulation / treatment planning note  The patient was taken to the CT simulator and laid in the supine position and a Frog leg position. His legs were in a Vac lock. A custom-made Styrofoam block was placed between the patient's thighs and underneath his scrotum. The patient's penis was swept upwards and taped away from the scrotum. High-resolution CT axial imaging was obtained the patient's pelvis. An isocenter was placed in the patient's scrotum he tolerated the procedure well-  No skin tattoos made.  Treatment planning note. The patient will be treated with en face electrons. Approximately 2 cm of blockage margin around the patient's scrotum and testis. I plan to treat this region to 30.6 Gray at 1.8 gray per fraction.

## 2012-08-10 ENCOUNTER — Ambulatory Visit: Payer: Managed Care, Other (non HMO) | Admitting: Radiation Oncology

## 2012-08-11 ENCOUNTER — Ambulatory Visit
Admission: RE | Admit: 2012-08-11 | Discharge: 2012-08-11 | Disposition: A | Discharge status: Home / Self Care | Payer: Managed Care, Other (non HMO) | Source: Ambulatory Visit | Attending: Radiation Oncology | Admitting: Radiation Oncology

## 2012-08-12 ENCOUNTER — Ambulatory Visit: Admission: RE | Admit: 2012-08-12 | Payer: Managed Care, Other (non HMO) | Source: Ambulatory Visit

## 2012-08-13 ENCOUNTER — Ambulatory Visit: Payer: Managed Care, Other (non HMO)

## 2012-08-16 ENCOUNTER — Ambulatory Visit
Admission: RE | Admit: 2012-08-16 | Discharge: 2012-08-16 | Disposition: A | Discharge status: Home / Self Care | Payer: Managed Care, Other (non HMO) | Source: Ambulatory Visit | Attending: Radiation Oncology | Admitting: Radiation Oncology

## 2012-08-16 DIAGNOSIS — C8589 Other specified types of non-Hodgkin lymphoma, extranodal and solid organ sites: Secondary | ICD-10-CM

## 2012-08-16 NOTE — Progress Notes (Signed)
   Weekly Management Note Current Dose: 1.8  Gy  Projected Dose: 30.6  Gy   Narrative:  The patient presents for routine under treatment assessment.  CBCT/MVCT images/Port film x-rays were reviewed.  The chart was checked.  Doing well. No complaints.    Physical Findings: No significant findings thus far. Well appearing.  Impression:  The patient is tolerating radiotherapy.  Plan:  Continue radiotherapy as planned.  ________________________________   Lonie Peak, M.D.

## 2012-08-17 ENCOUNTER — Ambulatory Visit
Admission: RE | Admit: 2012-08-17 | Discharge: 2012-08-17 | Disposition: A | Discharge status: Home / Self Care | Payer: Managed Care, Other (non HMO) | Source: Ambulatory Visit | Attending: Radiation Oncology | Admitting: Radiation Oncology

## 2012-08-18 ENCOUNTER — Ambulatory Visit
Admission: RE | Admit: 2012-08-18 | Discharge: 2012-08-18 | Disposition: A | Discharge status: Home / Self Care | Payer: Managed Care, Other (non HMO) | Source: Ambulatory Visit | Attending: Radiation Oncology | Admitting: Radiation Oncology

## 2012-08-19 ENCOUNTER — Ambulatory Visit
Admission: RE | Admit: 2012-08-19 | Discharge: 2012-08-19 | Disposition: A | Discharge status: Home / Self Care | Payer: Managed Care, Other (non HMO) | Source: Ambulatory Visit | Attending: Radiation Oncology | Admitting: Radiation Oncology

## 2012-08-20 ENCOUNTER — Ambulatory Visit
Admission: RE | Admit: 2012-08-20 | Discharge: 2012-08-20 | Disposition: A | Discharge status: Home / Self Care | Payer: Managed Care, Other (non HMO) | Source: Ambulatory Visit | Attending: Radiation Oncology | Admitting: Radiation Oncology

## 2012-08-23 ENCOUNTER — Ambulatory Visit
Admission: RE | Admit: 2012-08-23 | Discharge: 2012-08-23 | Disposition: A | Discharge status: Home / Self Care | Payer: Managed Care, Other (non HMO) | Source: Ambulatory Visit | Attending: Radiation Oncology | Admitting: Radiation Oncology

## 2012-08-23 ENCOUNTER — Encounter: Payer: Self-pay | Admitting: Radiation Oncology

## 2012-08-23 VITALS — BP 128/78 | HR 79 | Temp 98.3°F | Resp 20 | Wt 188.8 lb

## 2012-08-23 DIAGNOSIS — C8589 Other specified types of non-Hodgkin lymphoma, extranodal and solid organ sites: Secondary | ICD-10-CM

## 2012-08-23 NOTE — Progress Notes (Signed)
   Weekly Management Note Current Dose:   10.8Gy  Projected Dose:30.6Gy   Narrative:  The patient presents for routine under treatment assessment.  CBCT/MVCT images/Port film x-rays were reviewed.  The chart was checked. Over the past 2 week he has had a couple bouts of diarrhea.   Physical Findings:  weight is 188 lb 12.8 oz (85.639 kg). His oral temperature is 98.3 F (36.8 C). His blood pressure is 128/78 and his pulse is 79. His respiration is 20.  in no acute distress, drinking coffee  Impression:  The patient is tolerating radiotherapy. Plan:  Continue radiotherapy as planned. I spoke with him about drinking coffee and how this can contribute to diarrhea. It seems he is drinking this on an empty stomach in the morning. He was not aware that caffeine can cause loose stool. I talked with him about ways to him modify his caffeine intake to restore normal bowel function. I do not think the diarrhea is from his radiotherapy.  ________________________________   Lonie Peak, M.D.

## 2012-08-23 NOTE — Progress Notes (Signed)
Patient here weekly rad txs scrotum/testes 6/17 completed ,  vitsls wnl, post simm teaching , fatigue,skin irritation adn loss pubic hair only, had diarrhea last night took pepto bismul, resolved  Now, past 2 weeks "2 bouts with diarrhea", energy level good, worked out in yard Saturday and Sunday  9:31 AM

## 2012-08-24 ENCOUNTER — Ambulatory Visit
Admission: RE | Admit: 2012-08-24 | Discharge: 2012-08-24 | Disposition: A | Discharge status: Home / Self Care | Payer: Managed Care, Other (non HMO) | Source: Ambulatory Visit | Attending: Radiation Oncology | Admitting: Radiation Oncology

## 2012-08-25 ENCOUNTER — Ambulatory Visit
Admission: RE | Admit: 2012-08-25 | Discharge: 2012-08-25 | Disposition: A | Discharge status: Home / Self Care | Payer: Managed Care, Other (non HMO) | Source: Ambulatory Visit | Attending: Radiation Oncology | Admitting: Radiation Oncology

## 2012-08-26 ENCOUNTER — Ambulatory Visit
Admission: RE | Admit: 2012-08-26 | Discharge: 2012-08-26 | Disposition: A | Discharge status: Home / Self Care | Payer: Managed Care, Other (non HMO) | Source: Ambulatory Visit | Attending: Radiation Oncology | Admitting: Radiation Oncology

## 2012-08-27 ENCOUNTER — Ambulatory Visit
Admission: RE | Admit: 2012-08-27 | Discharge: 2012-08-27 | Disposition: A | Discharge status: Home / Self Care | Payer: Managed Care, Other (non HMO) | Source: Ambulatory Visit | Attending: Radiation Oncology | Admitting: Radiation Oncology

## 2012-08-30 ENCOUNTER — Ambulatory Visit
Admission: RE | Admit: 2012-08-30 | Discharge: 2012-08-30 | Disposition: A | Discharge status: Home / Self Care | Payer: Managed Care, Other (non HMO) | Source: Ambulatory Visit | Attending: Radiation Oncology | Admitting: Radiation Oncology

## 2012-08-31 ENCOUNTER — Ambulatory Visit
Admission: RE | Admit: 2012-08-31 | Discharge: 2012-08-31 | Disposition: A | Discharge status: Home / Self Care | Payer: Managed Care, Other (non HMO) | Source: Ambulatory Visit | Attending: Radiation Oncology | Admitting: Radiation Oncology

## 2012-08-31 ENCOUNTER — Encounter: Payer: Self-pay | Admitting: Radiation Oncology

## 2012-08-31 VITALS — BP 139/81 | HR 77 | Temp 97.6°F | Wt 189.4 lb

## 2012-08-31 DIAGNOSIS — R300 Dysuria: Secondary | ICD-10-CM

## 2012-08-31 DIAGNOSIS — C8589 Other specified types of non-Hodgkin lymphoma, extranodal and solid organ sites: Secondary | ICD-10-CM

## 2012-08-31 NOTE — Patient Instructions (Signed)
Apply Biafene to skin in affected area. See nursing tomorrow for urinalysis.

## 2012-08-31 NOTE — Progress Notes (Signed)
12/17 fractions to scrotum/testis.  Reports that after treatment on yesterday he experienced irritation in the scrotal region and this area felt "hot".  He had to reposition frequently.  He applied Gold bond cream which afforded relief and now states his discomfort is down from a level 6 on a scale of 0-10 to a level 2 today. [name] stated that he has noticed intermittent burning upon urination but none this am.

## 2012-08-31 NOTE — Progress Notes (Signed)
   Weekly Management Note Current Dose:  21.6 Gy  Projected Dose: 30.6  Gy   Narrative:  The patient presents for routine under treatment assessment.  CBCT/MVCT images/Port film x-rays were reviewed.  The chart was checked. Reports a burning sensation in the scrotal region. He applied gold Bond cream on this and it made him feel better. He continues to work. He does have occasional dysuria.  Physical Findings:  weight is 189 lb 6.4 oz (85.911 kg). His temperature is 97.6 F (36.4 C). His blood pressure is 139/81 and his pulse is 77.  no acute distress, standing to  Impression:  The patient is tolerating radiotherapy.  Plan:  Continue radiotherapy as planned. Recommended Biafine cream instead of the Gold Bond cream. Patient refused urinalysis today but will get it tomorrow.  ________________________________   Lonie Peak, M.D.

## 2012-09-01 ENCOUNTER — Ambulatory Visit: Admission: RE | Admit: 2012-09-01 | Payer: Managed Care, Other (non HMO) | Source: Ambulatory Visit

## 2012-09-01 ENCOUNTER — Ambulatory Visit (HOSPITAL_BASED_OUTPATIENT_CLINIC_OR_DEPARTMENT_OTHER)
Admission: RE | Admit: 2012-09-01 | Discharge: 2012-09-01 | Disposition: A | Discharge status: Home / Self Care | Payer: Managed Care, Other (non HMO) | Source: Ambulatory Visit | Attending: Radiation Oncology | Admitting: Radiation Oncology

## 2012-09-01 ENCOUNTER — Other Ambulatory Visit: Payer: Self-pay | Admitting: Radiation Oncology

## 2012-09-01 DIAGNOSIS — C8589 Other specified types of non-Hodgkin lymphoma, extranodal and solid organ sites: Secondary | ICD-10-CM

## 2012-09-01 DIAGNOSIS — R300 Dysuria: Secondary | ICD-10-CM

## 2012-09-01 LAB — URINALYSIS, MICROSCOPIC - CHCC
Leukocyte Esterase: NEGATIVE
Protein: NEGATIVE mg/dL
pH: 5 (ref 4.6–8.0)

## 2012-09-02 ENCOUNTER — Ambulatory Visit
Admission: RE | Admit: 2012-09-02 | Discharge: 2012-09-02 | Disposition: A | Discharge status: Home / Self Care | Payer: Managed Care, Other (non HMO) | Source: Ambulatory Visit | Attending: Radiation Oncology | Admitting: Radiation Oncology

## 2012-09-03 ENCOUNTER — Ambulatory Visit
Admission: RE | Admit: 2012-09-03 | Discharge: 2012-09-03 | Disposition: A | Discharge status: Home / Self Care | Payer: Managed Care, Other (non HMO) | Source: Ambulatory Visit | Attending: Radiation Oncology | Admitting: Radiation Oncology

## 2012-09-06 ENCOUNTER — Ambulatory Visit
Admission: RE | Admit: 2012-09-06 | Payer: Managed Care, Other (non HMO) | Source: Ambulatory Visit | Admitting: Radiation Oncology

## 2012-09-06 ENCOUNTER — Ambulatory Visit
Admission: RE | Admit: 2012-09-06 | Discharge: 2012-09-06 | Disposition: A | Discharge status: Home / Self Care | Payer: Managed Care, Other (non HMO) | Source: Ambulatory Visit | Attending: Radiation Oncology | Admitting: Radiation Oncology

## 2012-09-07 ENCOUNTER — Ambulatory Visit
Admission: RE | Admit: 2012-09-07 | Discharge: 2012-09-07 | Disposition: A | Discharge status: Home / Self Care | Payer: Managed Care, Other (non HMO) | Source: Ambulatory Visit | Attending: Radiation Oncology | Admitting: Radiation Oncology

## 2012-09-07 ENCOUNTER — Encounter: Payer: Self-pay | Admitting: Radiation Oncology

## 2012-09-07 DIAGNOSIS — C8589 Other specified types of non-Hodgkin lymphoma, extranodal and solid organ sites: Secondary | ICD-10-CM

## 2012-09-07 NOTE — Progress Notes (Signed)
Pt completed tx today, denies pain, loss of appetite, urinary or bowel problems or issues. Pt does have fatigue he states "is left from chemo". Pt completed chemo June 2013. Has FU appt.

## 2012-09-07 NOTE — Progress Notes (Signed)
Weekly Management Note:  Site: Scrotum/testis Current Dose:  3060  cGy Projected Dose: 3060  cGy  Narrative: The patient is seen today for routine under treatment assessment. CBCT/MVCT images/port films were reviewed. The chart was reviewed.   He is without new complaints today. He has applied Gold Bond cream for mild dermatitis. He denies dysuria. His urinalysis from earlier this week is without evidence for infection.  Physical Examination: There were no vitals filed for this visit..  Weight:  . No desquamation.  Impression: Radiation therapy well tolerated.  Plan: Followup visit with Dr. Basilio Cairo in one month.

## 2012-09-09 NOTE — Progress Notes (Signed)
  Radiation Oncology         [phone]) [phone] ________________________________  Name: [name] MRN: [id number]  Date: 09/07/2012  DOB: [birth year]  End of Treatment Note  Diagnosis:  The encounter diagnosis was Diffuse Large B-Cell Lymphoma. Stage IVE, testicular primary.  Indication for treatment:  Local Control  Radiation treatment dates:  08/16/2012-09/07/2012  Site/dose:    Scotum/ Testis / 30.6Gy/17 fractions  Beams/energy:  En face Electrons / 15 MeV  Narrative: The patient tolerated radiation treatment relatively well.   By the end of treatment, he had developed mild dermatitis.  Plan: The patient has completed radiation treatment. The patient will return to radiation oncology clinic for routine followup in one month. I advised them to call or return sooner if they have any questions or concerns related to their recovery or treatment.  -----------------------------------  Lonie Peak, MD

## 2012-09-14 ENCOUNTER — Other Ambulatory Visit (HOSPITAL_BASED_OUTPATIENT_CLINIC_OR_DEPARTMENT_OTHER): Payer: Managed Care, Other (non HMO) | Admitting: Lab

## 2012-09-14 ENCOUNTER — Ambulatory Visit (HOSPITAL_BASED_OUTPATIENT_CLINIC_OR_DEPARTMENT_OTHER): Payer: Managed Care, Other (non HMO) | Admitting: Hematology & Oncology

## 2012-09-14 VITALS — BP 121/71 | HR 81 | Temp 97.9°F | Resp 18 | Ht 70.0 in | Wt 189.0 lb

## 2012-09-14 DIAGNOSIS — E349 Endocrine disorder, unspecified: Secondary | ICD-10-CM

## 2012-09-14 DIAGNOSIS — C8330 Diffuse large B-cell lymphoma, unspecified site: Secondary | ICD-10-CM

## 2012-09-14 DIAGNOSIS — C8595 Non-Hodgkin lymphoma, unspecified, lymph nodes of inguinal region and lower limb: Secondary | ICD-10-CM

## 2012-09-14 DIAGNOSIS — M810 Age-related osteoporosis without current pathological fracture: Secondary | ICD-10-CM

## 2012-09-14 LAB — CBC WITH DIFFERENTIAL (CANCER CENTER ONLY)
BASO#: 0.1 10*3/uL (ref 0.0–0.2)
EOS%: 5.7 % (ref 0.0–7.0)
HCT: 41.4 % (ref 38.7–49.9)
HGB: 14.6 g/dL (ref 13.0–17.1)
MCH: 31.5 pg (ref 28.0–33.4)
MCHC: 35.3 g/dL (ref 32.0–35.9)
MONO%: 15.5 % — ABNORMAL HIGH (ref 0.0–13.0)
NEUT%: 36.2 % — ABNORMAL LOW (ref 40.0–80.0)

## 2012-09-14 NOTE — Progress Notes (Signed)
This office note has been dictated.

## 2012-09-14 NOTE — Addendum Note (Signed)
Addended by: Arlan Organ R on: 09/14/2012 10:14 AM   Modules accepted: Orders

## 2012-09-15 LAB — COMPREHENSIVE METABOLIC PANEL
AST: 30 U/L (ref 0–37)
Alkaline Phosphatase: 88 U/L (ref 39–117)
BUN: 12 mg/dL (ref 6–23)
Calcium: 9.4 mg/dL (ref 8.4–10.5)
Chloride: 101 mEq/L (ref 96–112)
Creatinine, Ser: 0.99 mg/dL (ref 0.50–1.35)

## 2012-09-15 LAB — VITAMIN D 25 HYDROXY (VIT D DEFICIENCY, FRACTURES): Vit D, 25-Hydroxy: 21 ng/mL — ABNORMAL LOW (ref 30–89)

## 2012-09-15 NOTE — Progress Notes (Signed)
CC:   Lupita Raider, M.D.  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  CURRENT THERAPY:  The patient has completed all of his chemo and radiation therapy.  INTERIM HISTORY:  Mr. [name] comes in for followup.  He completed his radiation therapy a couple weeks ago.  He tolerated this well.  He feels good.  He stills feels a little tired.  He has had no problems with pain.  There has been no cough or shortness breath.  He has had no nausea or vomiting.  There has been no change in bowel or bladder habits.  He has not noted any issues with rashes.  Of note, when he saw him back in September, his testosterone was 200.  PHYSICAL EXAM:  General:  This is a thin white gentleman in no obvious distress.  Vital signs:  Temperature 97.9, pulse 81, respiratory rate 18, blood pressure 121/71.  Weight is 189.  Head and neck: Normocephalic, atraumatic skull.  There are no ocular or oral lesions. There are no palpable cervical or supraclavicular lymph nodes.  Lungs: Clear bilaterally.  Cardiac:  Regular rate and rhythm with a normal S1, S2.  There are no murmurs, rubs or bruits.  Abdomen:  Soft with good bowel sounds.  There is no palpable abdominal mass.  There is no fluid wave.  There is no palpable hepatosplenomegaly.  Back:  No tenderness over the spine, ribs, or hips.  Extremities:  No clubbing, cyanosis or edema.  Skin:  No rashes, ecchymosis or petechiae.  LABORATORY STUDIES:  White cell count is 3.7, hemoglobin 14.6, hematocrit 41.4, platelet count 153.  IMPRESSION:  Mr. Palladino is a 66 year old gentleman with a diffuse large cell lymphoma of the left testicle.  He got 6 cycles of chemotherapy with R-CHOP.  He then received some scrotal radiation therapy.  We tried intrathecal chemo, but he tolerated this very very poorly.  He only got 1 dose.  For now, we will follow him up.  His last PET scan was negative.  As he is asymptomatic, I do not see that we need to do a  scan on him unless there is symptoms.  We will continue to check his testosterone level.  We will plan to get him back in 3 months' time.    ______________________________ Josph Macho, M.D. PRE/MEDQ  D:  09/14/2012  T:  09/15/2012  Job:  [id number]

## 2012-10-05 ENCOUNTER — Encounter: Payer: Self-pay | Admitting: Radiation Oncology

## 2012-10-05 ENCOUNTER — Ambulatory Visit
Admission: RE | Admit: 2012-10-05 | Discharge: 2012-10-05 | Disposition: A | Discharge status: Home / Self Care | Payer: Managed Care, Other (non HMO) | Source: Ambulatory Visit | Attending: Radiation Oncology | Admitting: Radiation Oncology

## 2012-10-05 VITALS — BP 135/80 | HR 87 | Temp 97.7°F | Wt 190.4 lb

## 2012-10-05 DIAGNOSIS — C8589 Other specified types of non-Hodgkin lymphoma, extranodal and solid organ sites: Secondary | ICD-10-CM

## 2012-10-05 HISTORY — DX: Personal history of irradiation: Z92.3

## 2012-10-05 HISTORY — DX: Personal history of antineoplastic chemotherapy: Z92.21

## 2012-10-05 NOTE — Progress Notes (Addendum)
  Radiation Oncology         [phone]) [phone] ________________________________  Name: [name] MRN: [id number]  Date: 10/05/2012  DOB: 11/13/1946  Follow-Up Visit Note  CC:   Keith Coupe, MD Keith Organ, MD  Diagnosis:   Diffuse large cell lymphoma, testicular  Interval Since Last Radiation:  Approximately one month  Narrative:  The patient returns today for routine follow-up. His skin has healed well in the scrotal area. He did lose hair there. No more itching. Main complaints today are blurry vision over the past 2 weeks as well as fatigue. He is seen an ophthalmologist tomorrow. He has not had any recent eye exams. I do not believe he has had a slit light exam.                           ALLERGIES:  is allergic to morphine and related and motrin.  Meds: Current Outpatient Prescriptions  Medication Sig Dispense Refill  . acetaminophen (TYLENOL) 325 MG tablet Take 650 mg by mouth every 6 (six) hours as needed.      Marland Kitchen aspirin 81 MG tablet Take 81 mg by mouth daily. Pt states he takes occ.      . [DISCONTINUED] prochlorperazine (COMPAZINE) 10 MG tablet Take 1 tablet (10 mg total) by mouth every 6 (six) hours as needed (Nausea or vomiting).  30 tablet  6    Physical Findings: The patient is in no acute distress. Patient is alert and oriented.  weight is 190 lb 6.4 oz (86.365 kg). His temperature is 97.7 F (36.5 C). His blood pressure is 135/80 and his pulse is 87. .  No significant changes. Skin exam and the scrotal region was deferred today.  Lab Findings: Lab Results  Component Value Date   WBC 3.7* 09/14/2012   HGB 14.6 09/14/2012   HCT 41.4 09/14/2012   MCV 89 09/14/2012   PLT 153 09/14/2012     Radiographic Findings: No results found.  Impression/Plan:  He has recovered well from radiotherapy. Of some concern, is his new-onset blurred vision. Testicular lymphoma is a risk factor for failure in the vitreous fluid/eyes.   I called the [place] center  and left a message with the nurse. She informed me that the patient's ophthalmologist will page me back this afternoon to talk about [name]. I would like to request that he undergo a slit light exam to rule out lymphoma as the cause of his blurry vision. Would like to be CC'd to that visit note, as well.  Otherwise, I will see the patient back on a PRN basis. He knows to call if any issues of concern arise. _____________________________________   Lonie Peak, MD

## 2012-10-05 NOTE — Progress Notes (Signed)
Mr. Costilla is one month post radiation to his Scrotum/tesits.  He denies any pain or soreness and states he is voiding without any difficulty.  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  He does admit to fatigue but rest as needed.  His main concern today is that since chemotherapy and radiation he notes more blurred vision.  He has a future visit scheduled for an eye exam.

## 2012-10-08 ENCOUNTER — Ambulatory Visit: Payer: Managed Care, Other (non HMO) | Admitting: Radiation Oncology

## 2012-11-23 IMAGING — CT NM PET TUM IMG RESTAG (PS) SKULL BASE T - THIGH
6 series · 25 of 25 positions shown · IV contrast (350 OM)
Comparison: None.

CLINICAL DATA: Initial treatment strategy for lymphoma.

NUCLEAR MEDICINE PET CT INITIAL (PI) SKULL BASE TO THIGH
TECHNIQUE: 18.4 mCi F-18 FDG was injected intravenously via the
right antecubital fossa .  Full-ring PET imaging was performed from
the skull base through the mid-thighs 63   minutes after injection.
CT data was obtained and used for attenuation correction and
anatomic localization only.  (This was not acquired as a diagnostic
CT examination.)
Fasting Blood Glucose:  142

[Series 1: pet ac · axial · 3.3mm · 4.69mm/px · z∈[-915,-45]mm · 5 of 267 slices shown]
[im 1/267]
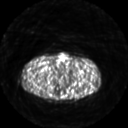
[im 67/267]
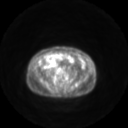
[im 134/267]
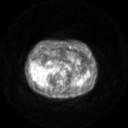
[im 200/267]
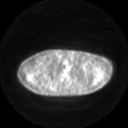
[im 267/267]
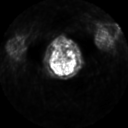

[Series 2: pet nac · axial · 3.3mm · 4.69mm/px · z∈[-915,-45]mm · 6 of 267 slices shown]
[im 1/267]
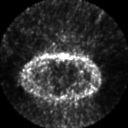
[im 54/267]
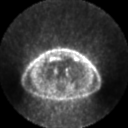
[im 107/267]
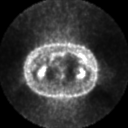
[im 160/267]
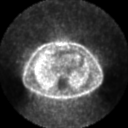
[im 213/267]
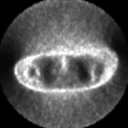
[im 267/267]
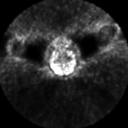

[Series 2: ct images · axial · 3.8mm · 0.98mm/px · z∈[-915,-45]mm · 5 of 267 slices shown]
[im 1/267]
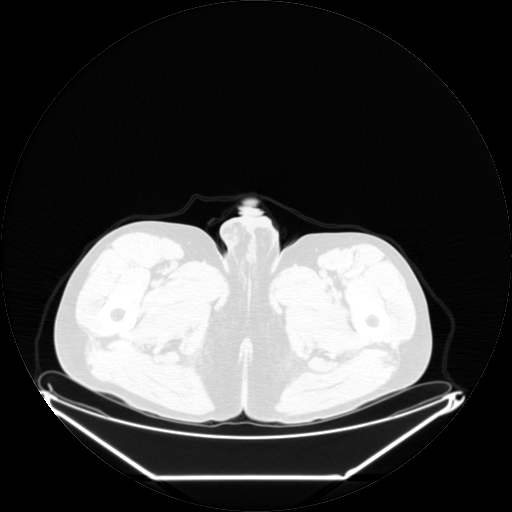
[im 67/267]
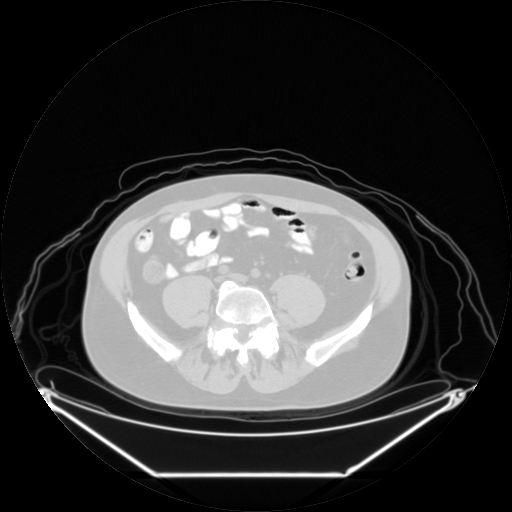
[im 134/267]
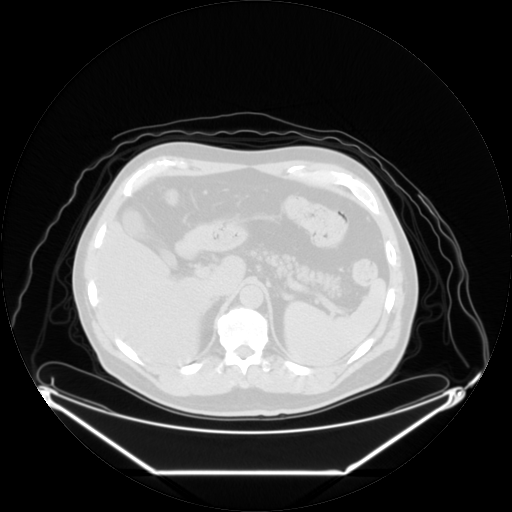
[im 200/267]
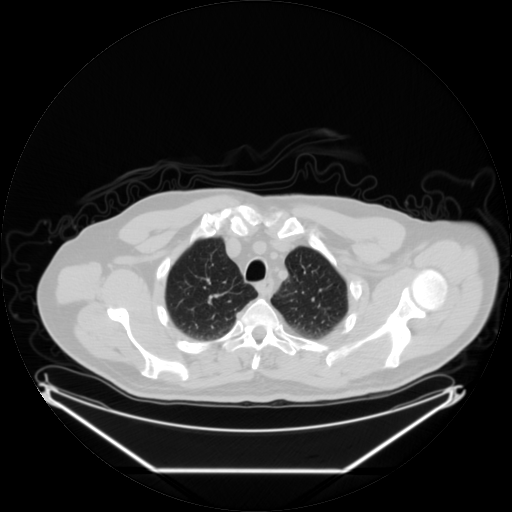
[im 267/267  brain]
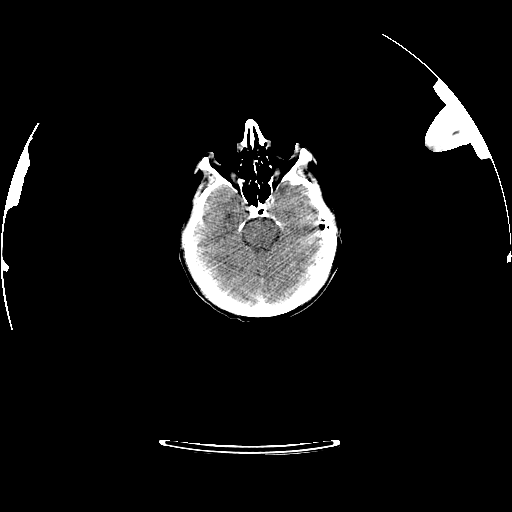

[Series 123: mip · coronal · 3.3mm · 4.69mm/px · 1 of 30 slices shown]
[im 1/30]
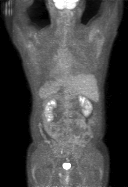

[Series 151: reformatted · axial · 3.3mm · 3.91mm/px · z∈[-915,-45]mm · 6 of 265 slices shown (1 of 2)]
[im 1/265]
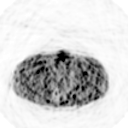
[im 53/265]
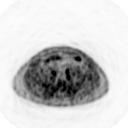
[im 106/265]
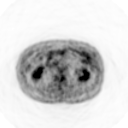
[im 159/265]
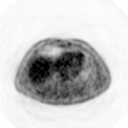
[im 212/265]
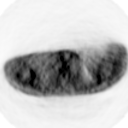
[im 265/265]
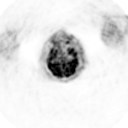

[Series 153: reformatted · coronal · 4.7mm · 6.98mm/px · 2 of 73 slices shown (2 of 2)]
[im 1/73]
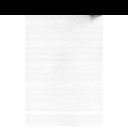
[im 73/73]
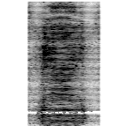

[25 of 25 positions shown; findings below may reference images not displayed]

FINDINGS: There are no hypermetabolic lymph nodes within the soft
tissues of the neck.

No supraclavicular adenopathy.

No axillary or supraclavicular hypermetabolic lymph nodes.  There
are no hypermetabolic lymph nodes within the mediastinum or hilar
regions.

There is no pericardial or pleural effusion identified.

No hypermetabolic pulmonary parenchymal nodule or mass identified.

No abnormal uptake within the liver or.  The spleen is negative.

Both adrenal glands appear within normal limits.

The pancreas appears normal.

Gallbladder normal.  No biliary dilatation.

There are postoperative changes identified within the left inguinal
canal.  Associated increased uptake within the left inguinal canal
is likely postoperative.

There is a residual periaortic soft tissue which measures 1 cm,
image 174.  No significant FDG uptake is identified within this
structure.  Previously this measured 2.4 cm and exhibited marked
increased FDG uptake.

There is no abnormal FDG uptake identified within the visualized
axial or appendicular skeleton.
IMPRESSION: 1.  Findings consistent with complete metabolic response to
therapy.  There is no residual or recurrent hypermetabolic tumor.
2.  Residual soft tissue within the periaortic region has decreased
in size from previous exam and no longer exhibits increased FDG
uptake.
3.  Postoperative change identified within the left inguinal canal.

## 2012-12-15 ENCOUNTER — Other Ambulatory Visit: Payer: Managed Care, Other (non HMO) | Admitting: Lab

## 2012-12-15 ENCOUNTER — Ambulatory Visit (HOSPITAL_BASED_OUTPATIENT_CLINIC_OR_DEPARTMENT_OTHER): Payer: Managed Care, Other (non HMO) | Admitting: Hematology & Oncology

## 2012-12-15 VITALS — BP 131/73 | HR 72 | Temp 97.5°F | Resp 18 | Ht 70.0 in | Wt 186.0 lb

## 2012-12-15 DIAGNOSIS — C8330 Diffuse large B-cell lymphoma, unspecified site: Secondary | ICD-10-CM

## 2012-12-15 DIAGNOSIS — M818 Other osteoporosis without current pathological fracture: Secondary | ICD-10-CM

## 2012-12-15 DIAGNOSIS — E349 Endocrine disorder, unspecified: Secondary | ICD-10-CM

## 2012-12-15 DIAGNOSIS — C8595 Non-Hodgkin lymphoma, unspecified, lymph nodes of inguinal region and lower limb: Secondary | ICD-10-CM

## 2012-12-15 LAB — CBC WITH DIFFERENTIAL (CANCER CENTER ONLY)
Eosinophils Absolute: 0.3 10*3/uL (ref 0.0–0.5)
LYMPH#: 2 10*3/uL (ref 0.9–3.3)
LYMPH%: 46.4 % (ref 14.0–48.0)
MCV: 91 fL (ref 82–98)
MONO#: 0.6 10*3/uL (ref 0.1–0.9)
NEUT#: 1.4 10*3/uL — ABNORMAL LOW (ref 1.5–6.5)
Platelets: 156 10*3/uL (ref 145–400)
RBC: 4.79 10*6/uL (ref 4.20–5.70)
WBC: 4.2 10*3/uL (ref 4.0–10.0)

## 2012-12-15 NOTE — Progress Notes (Signed)
This office note has been dictated.

## 2012-12-16 LAB — COMPREHENSIVE METABOLIC PANEL
Albumin: 4.5 g/dL (ref 3.5–5.2)
CO2: 27 mEq/L (ref 19–32)
Calcium: 9.4 mg/dL (ref 8.4–10.5)
Chloride: 107 mEq/L (ref 96–112)
Glucose, Bld: 84 mg/dL (ref 70–99)
Potassium: 4 mEq/L (ref 3.5–5.3)
Sodium: 143 mEq/L (ref 135–145)
Total Protein: 6.8 g/dL (ref 6.0–8.3)

## 2012-12-16 LAB — LACTATE DEHYDROGENASE: LDH: 137 U/L (ref 94–250)

## 2012-12-16 LAB — TESTOSTERONE, FREE, TOTAL, SHBG: Testosterone: 233.67 ng/dL — ABNORMAL LOW (ref 300–890)

## 2012-12-16 NOTE — Progress Notes (Signed)
CC:   Lupita Raider, M.D.  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  CURRENT THERAPY:  Observation.  INTERIM HISTORY:  Mr. Hunsucker comes in for followup.  It has now been a year since he started his treatments.  He looks real good.  He has had no complaints at all.  He does not have any problems with hot flashes or sweats.  He does state that his cheeks do get red on occasion.  He has had no cough or shortness of breath.  He has had no fevers.  We last checked his PET scan back in May.  There was no evidence of active disease at that time.  He has had no headache.  PHYSICAL EXAMINATION:  This is a well-developed, well-nourished white gentleman in no obvious distress.  Vital signs:  Temperature 97.5, pulse 72, respiratory rate 18, blood pressure 131/73.  Weight is 186. Head/neck:  Normocephalic, atraumatic skull.  There are no ocular or oral lesions.  There are no palpable cervical or supraclavicular lymph nodes.  Lungs:  Clear bilaterally.  Cardiac:  Regular rate and rhythm with a normal S1, S2.  There are no murmurs, rubs or bruits.  Abdomen: Soft with good bowel sounds.  He has a well-healed left orchectomy scar. There is no fluid wave.  There is no palpable hepatosplenomegaly. Extremities:  No clubbing, cyanosis or edema.  Axillary:  No bilateral axillary adenopathy.  Neurological:  No focal neurological deficits.  LABORATORY STUDIES:  White cell count is 4.2, hemoglobin 15.1, hematocrit 43.4, platelet count 156.  IMPRESSION:  Mr. Warmuth is a 67 year old gentleman with a history of large-cell lymphoma of the left testicle.  We gave him radiation and chemotherapy.  We tried to give him intrathecal chemotherapy, but he did not tolerate this well at all.  For now, I do not see any clinical evidence of recurrent disease.  I do not see that we need to put him through any scans right now.  His vitamin D is low.  I told him that he really  needs to take vitamin D.  I will make sure he takes 2000 units a day.  We will plan to get him back in 3 more months for followup.   ______________________________ Josph Macho, M.D. PRE/MEDQ  D:  12/15/2012  T:  12/16/2012  Job:  [id number]

## 2012-12-24 ENCOUNTER — Encounter: Payer: Self-pay | Admitting: *Deleted

## 2013-01-12 ENCOUNTER — Telehealth: Payer: Self-pay | Admitting: *Deleted

## 2013-01-12 NOTE — Telephone Encounter (Signed)
Called patient to let him know that his testosterone level was low.  Reviewed side effects of low testosterone level.  Patient does not feel he needs any gel at this time .  Reinforced with patient that he needs to double his dose of Vitamin D. And take 2000 u daily.

## 2013-01-12 NOTE — Telephone Encounter (Signed)
Message copied by Anselm Jungling on Wed Jan 12, 2013  1:23 PM ------      Message from: Arlan Organ R      Created: Thu Dec 23, 2012  5:52 PM       Call - testosterone is low!!  We can call in some topical gel if he wishes!!!  If not, make sure that he is taking Vit D 2000units a day!!!!  Cindee Lame ------

## 2013-01-12 NOTE — Telephone Encounter (Signed)
Message copied by Anselm Jungling on Wed Jan 12, 2013  1:25 PM ------      Message from: Arlan Organ R      Created: Thu Dec 23, 2012  5:52 PM       Call - testosterone is low!!  We can call in some topical gel if he wishes!!!  If not, make sure that he is taking Vit D 2000units a day!!!!  Cindee Lame ------

## 2013-03-09 IMAGING — RF DG FLUORO GUIDE LUMBAR PUNCTURE
1 series · 1 of 1 positions shown · non-contrast
Comparison: none

CLINICAL DATA: Lymphoma

FLUOROSCOPICALLY GUIDED LUMBAR PUNCTURE FOR INTRATHECAL
CHEMOTHERAPY
Fluoroscopy time:  0.5 minutes.
TECHNIQUE: Informed consent was obtained from the patient prior to
the procedure, including potential complications of headache,
allergy, and pain.   With the patient prone, the lower back was
prepped with Betadine.  1% Lidocaine was used for local anesthesia.
Lumbar puncture was performed at the L2-3 level using a 20 gauge
needle with return of clear CSF. Opening pressure 9 ml water.  CSF
sample was obtained and sent to the laboratory.  5 ml  of  Depocyt
was injected into the subarachnoid space.  The patient tolerated
the procedure well without apparent complication. 0.5 ml Solu-
Cortef injected into the subarachnoid space.

[Series 1: run · 1 of 1 slices shown]
[im 1/1]
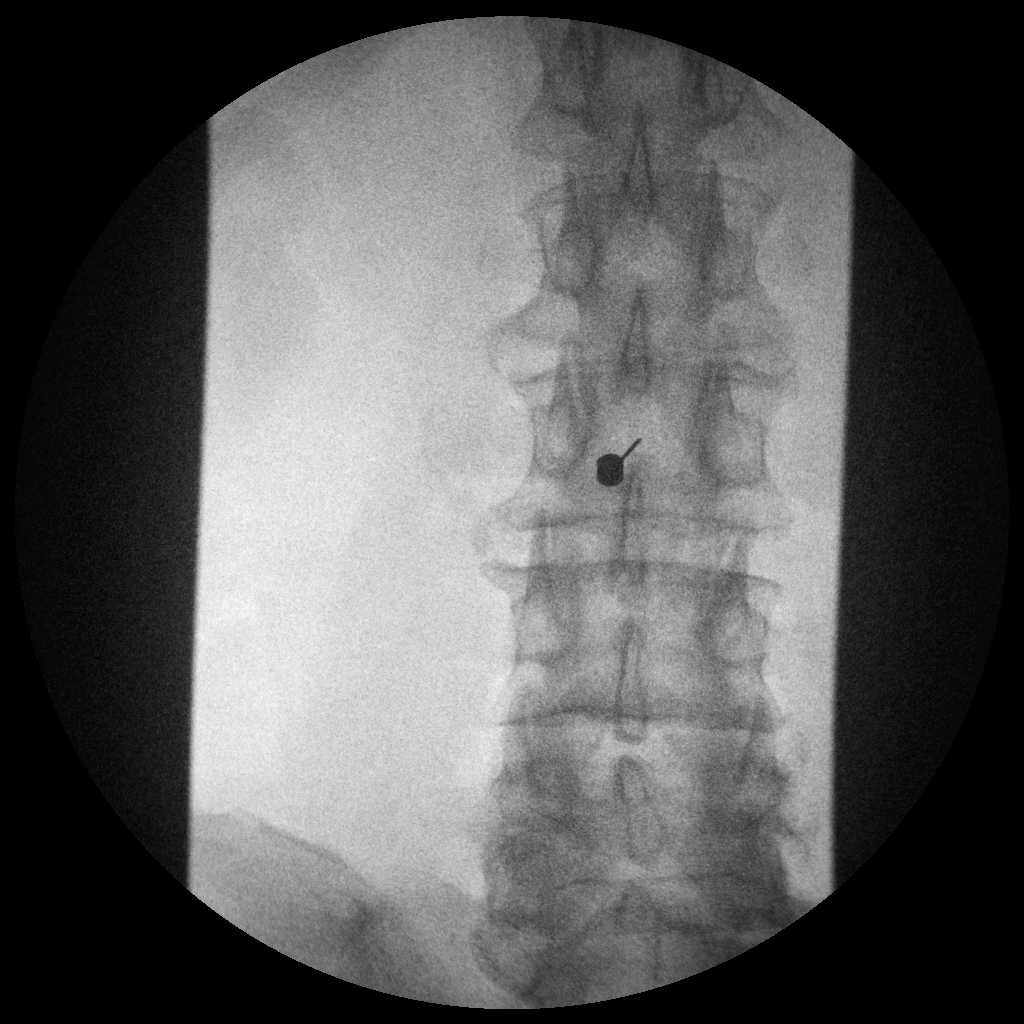

[1 of 1 positions shown; findings below may reference images not displayed]

IMPRESSION: Successful lumbar puncture and injection of intrathecal
chemotherapy.

## 2013-03-15 ENCOUNTER — Other Ambulatory Visit (HOSPITAL_BASED_OUTPATIENT_CLINIC_OR_DEPARTMENT_OTHER): Payer: Managed Care, Other (non HMO) | Admitting: Lab

## 2013-03-15 ENCOUNTER — Ambulatory Visit (HOSPITAL_BASED_OUTPATIENT_CLINIC_OR_DEPARTMENT_OTHER): Payer: Managed Care, Other (non HMO) | Admitting: Medical

## 2013-03-15 VITALS — BP 115/67 | HR 64 | Temp 97.2°F | Resp 18 | Ht 70.0 in | Wt 179.0 lb

## 2013-03-15 DIAGNOSIS — C8595 Non-Hodgkin lymphoma, unspecified, lymph nodes of inguinal region and lower limb: Secondary | ICD-10-CM

## 2013-03-15 DIAGNOSIS — C8330 Diffuse large B-cell lymphoma, unspecified site: Secondary | ICD-10-CM

## 2013-03-15 DIAGNOSIS — M818 Other osteoporosis without current pathological fracture: Secondary | ICD-10-CM

## 2013-03-15 DIAGNOSIS — E559 Vitamin D deficiency, unspecified: Secondary | ICD-10-CM

## 2013-03-15 DIAGNOSIS — E349 Endocrine disorder, unspecified: Secondary | ICD-10-CM

## 2013-03-15 LAB — CBC WITH DIFFERENTIAL (CANCER CENTER ONLY)
BASO#: 0.1 10*3/uL (ref 0.0–0.2)
BASO%: 2 % (ref 0.0–2.0)
EOS%: 5.9 % (ref 0.0–7.0)
HCT: 40.6 % (ref 38.7–49.9)
HGB: 14.1 g/dL (ref 13.0–17.1)
LYMPH%: 39.3 % (ref 14.0–48.0)
MCHC: 34.7 g/dL (ref 32.0–35.9)
MCV: 92 fL (ref 82–98)
NEUT%: 38.2 % — ABNORMAL LOW (ref 40.0–80.0)
RDW: 12.7 % (ref 11.1–15.7)

## 2013-03-15 NOTE — Progress Notes (Signed)
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  CURRENT THERAPY:  Observation.  INTERIM HISTORY:  [name] presents today for an office followup visit.  Overall, he is doing quite well.  He does not report any problems.  She's not having any problems with any night sweats, or hot flashes.  He does state, that his cheeks get red, on occasion.  He's not reporting any type of cough, or shortness of breath.  His last PET scan.  Back in May, revealed no evidence of active disease at that time.  He states he is on vitamin D., however, he does not take it religiously.  He does not report any unintentional weight loss.  He denies any nausea, vomiting, diarrhea, constipation, chest pain, shortness of breath, or cough.  He denies any fevers, chills, or night sweats.  He denies any type of abdominal pain.  He denies any lower leg swelling.  He denies any obvious, or abnormal bleeding.  He denies any headaches, visual changes, or rashes.  Review of Systems: Constitutional:Negative for malaise/fatigue, fever, chills, weight loss, diaphoresis, activity change, appetite change, and unexpected weight change.  HEENT: Negative for double vision, blurred vision, visual loss, ear pain, tinnitus, congestion, rhinorrhea, epistaxis sore throat or sinus disease, oral pain/lesion, tongue soreness Respiratory: Negative for cough, chest tightness, shortness of breath, wheezing and stridor.  Cardiovascular: Negative for chest pain, palpitations, leg swelling, orthopnea, PND, DOE or claudication Gastrointestinal: Negative for nausea, vomiting, abdominal pain, diarrhea, constipation, blood in stool, melena, hematochezia, abdominal distention, anal bleeding, rectal pain, anorexia and hematemesis.  Genitourinary: Negative for dysuria, frequency, hematuria,  Musculoskeletal: Negative for myalgias, back pain, joint swelling, arthralgias and gait problem.  Skin: Negative for rash, color change,  pallor and wound.  Neurological:. Negative for dizziness/light-headedness, tremors, seizures, syncope, facial asymmetry, speech difficulty, weakness, numbness, headaches and paresthesias.  Hematological: Negative for adenopathy. Does not bruise/bleed easily.  Psychiatric/Behavioral:  Negative for depression, no loss of interest in normal activity or change in sleep pattern.   Physical Exam: This is a 67 year old, well-developed, well-nourished, white gentleman, in no obvious distress Vitals: Temperature 97.2 degrees, pulse 64, respirations 18, blood pressure 115/67, weight 179 pounds HEENT reveals a normocephalic, atraumatic skull, no scleral icterus, no oral lesions  Neck is supple without any cervical or supraclavicular adenopathy.  Lungs are clear to auscultation bilaterally. There are no wheezes, rales or rhonci Cardiac is regular rate and rhythm with a normal S1 and S2. There are no murmurs, rubs, or bruits.  Abdomen is soft with good bowel sounds, there is no palpable mass. There is no palpable hepatosplenomegaly. There is no palpable fluid wave.  Musculoskeletal no tenderness of the spine, ribs, or hips.  Extremities there are no clubbing, cyanosis, or edema.  Skin no petechia, purpura or ecchymosis Neurologic is nonfocal.  Laboratory Data: White count 3.6, hemoglobin 14.1, hematocrit 40.6, platelets 146,000  Current Outpatient Prescriptions on File Prior to Visit  Medication Sig Dispense Refill  . acetaminophen (TYLENOL) 325 MG tablet Take 650 mg by mouth every 6 (six) hours as needed.      Marland Kitchen aspirin 81 MG tablet Take 81 mg by mouth daily. Pt states he takes occ.      . [DISCONTINUED] prochlorperazine (COMPAZINE) 10 MG tablet Take 1 tablet (10 mg total) by mouth every 6 (six) hours as needed (Nausea or vomiting).  30 tablet  6   No current facility-administered medications on file prior to visit.   Assessment/Plan: This is  a pleasant, 67 year old, white gentleman, with the  following issues:  #1.  History of large cell lymphoma of the left testicle.  He did have radiation and chemotherapy.  We try to give him intrathecal chemotherapy, but he did not tolerate this well.  On today's exam, there is no clinical evidence of recurrent disease.  There is no need for any scans right now.  #2.  Vitamin D deficiency.  He remains on vitamin D 2000 units daily.  #3.  Followup.  [name] will follow back up with [place] in 3 months, but before then should there be questions or concerns.

## 2013-03-16 LAB — LACTATE DEHYDROGENASE: LDH: 119 U/L (ref 94–250)

## 2013-03-16 LAB — VITAMIN D 25 HYDROXY (VIT D DEFICIENCY, FRACTURES): Vit D, 25-Hydroxy: 22 ng/mL — ABNORMAL LOW (ref 30–89)

## 2013-03-21 ENCOUNTER — Telehealth: Payer: Self-pay | Admitting: *Deleted

## 2013-03-21 NOTE — Telephone Encounter (Signed)
Message copied by Mirian Capuchin on Mon Mar 21, 2013 12:02 PM ------      Message from: Josph Macho      Created: Fri Mar 18, 2013  6:45 PM       Call - Vit D is low!!  Is he taking anything!!  Testosterone is low!  Does he want anything for this??  Pete ------

## 2013-03-24 ENCOUNTER — Telehealth: Payer: Self-pay | Admitting: *Deleted

## 2013-03-24 NOTE — Telephone Encounter (Signed)
Message copied by Anselm Jungling on Thu Mar 24, 2013 11:28 AM ------      Message from: [name]      Created: Fri Mar 18, 2013  6:45 PM       Call - Vit D is low!!  Is he taking anything!!  Testosterone is low!  Does he want anything for this??  Pete ------

## 2013-03-24 NOTE — Telephone Encounter (Signed)
Called patient and left message on answering machine to call us for information about labwork (vitamin D and testosterone levels.)

## 2013-04-02 ENCOUNTER — Emergency Department (HOSPITAL_COMMUNITY)
Admission: EM | Admit: 2013-04-02 | Discharge: 2013-04-02 | Disposition: A | Discharge status: Home / Self Care | Payer: Managed Care, Other (non HMO) | Attending: Emergency Medicine | Admitting: Emergency Medicine

## 2013-04-02 ENCOUNTER — Encounter (HOSPITAL_COMMUNITY): Payer: Self-pay

## 2013-04-02 DIAGNOSIS — Z8669 Personal history of other diseases of the nervous system and sense organs: Secondary | ICD-10-CM | POA: Insufficient documentation

## 2013-04-02 DIAGNOSIS — Z87442 Personal history of urinary calculi: Secondary | ICD-10-CM | POA: Insufficient documentation

## 2013-04-02 DIAGNOSIS — Z87828 Personal history of other (healed) physical injury and trauma: Secondary | ICD-10-CM | POA: Insufficient documentation

## 2013-04-02 DIAGNOSIS — Y9389 Activity, other specified: Secondary | ICD-10-CM | POA: Insufficient documentation

## 2013-04-02 DIAGNOSIS — W298XXA Contact with other powered powered hand tools and household machinery, initial encounter: Secondary | ICD-10-CM | POA: Insufficient documentation

## 2013-04-02 DIAGNOSIS — S91009A Unspecified open wound, unspecified ankle, initial encounter: Secondary | ICD-10-CM | POA: Insufficient documentation

## 2013-04-02 DIAGNOSIS — Z8547 Personal history of malignant neoplasm of testis: Secondary | ICD-10-CM | POA: Insufficient documentation

## 2013-04-02 DIAGNOSIS — Z923 Personal history of irradiation: Secondary | ICD-10-CM | POA: Insufficient documentation

## 2013-04-02 DIAGNOSIS — Z9221 Personal history of antineoplastic chemotherapy: Secondary | ICD-10-CM | POA: Insufficient documentation

## 2013-04-02 DIAGNOSIS — Z87448 Personal history of other diseases of urinary system: Secondary | ICD-10-CM | POA: Insufficient documentation

## 2013-04-02 DIAGNOSIS — N189 Chronic kidney disease, unspecified: Secondary | ICD-10-CM | POA: Insufficient documentation

## 2013-04-02 DIAGNOSIS — Y9289 Other specified places as the place of occurrence of the external cause: Secondary | ICD-10-CM | POA: Insufficient documentation

## 2013-04-02 DIAGNOSIS — S81009A Unspecified open wound, unspecified knee, initial encounter: Secondary | ICD-10-CM | POA: Insufficient documentation

## 2013-04-02 DIAGNOSIS — Z87891 Personal history of nicotine dependence: Secondary | ICD-10-CM | POA: Insufficient documentation

## 2013-04-02 DIAGNOSIS — S81812A Laceration without foreign body, left lower leg, initial encounter: Secondary | ICD-10-CM

## 2013-04-02 DIAGNOSIS — Z7982 Long term (current) use of aspirin: Secondary | ICD-10-CM | POA: Insufficient documentation

## 2013-04-02 MED ORDER — CEPHALEXIN 500 MG PO CAPS: 500.0000 mg | ORAL_CAPSULE | Freq: Two times a day (BID) | ORAL | Status: DC

## 2013-04-02 MED ORDER — HYDROCODONE-ACETAMINOPHEN 5-325 MG PO TABS: 1.0000 | ORAL_TABLET | Freq: Four times a day (QID) | ORAL | Status: DC | PRN
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
## 2013-04-02 NOTE — ED Notes (Signed)
He states he lac. His left lat. Lower leg (proximal calf area) with a chainsaw he was using this afternoon. He has exposed muscle layer; lac. Is not bleeding at present. CMS intact all toes bilat.

## 2013-04-04 NOTE — ED Provider Notes (Signed)
67 y.o. Male with chain saw laceration to lle   Discussed with patient and advised regarding non use of prophylactic abx.  Discussed s/s to seek recheck for.     I performed a history and physical examination of [name] and discussed his management with Ms Trellis Moment.  I agree with the history, physical, assessment, and plan of care, with the following exceptions: None  I was present for the following procedures: None Time Spent in Critical Care of the patient: None Time spent in discussions with the patient and family: 7  Radiance Deady S    Hilario Quarry, MD 04/04/13 [phone]

## 2013-04-05 ENCOUNTER — Telehealth: Payer: Self-pay | Admitting: *Deleted

## 2013-04-05 NOTE — Telephone Encounter (Signed)
Message copied by Mirian Capuchin on Tue Apr 05, 2013  3:24 PM ------      Message from: Josph Macho      Created: Fri Mar 18, 2013  6:45 PM       Call - Vit D is low!!  Is he taking anything!!  Testosterone is low!  Does he want anything for this??  Pete ------

## 2013-04-05 NOTE — Telephone Encounter (Signed)
Test

## 2013-04-12 ENCOUNTER — Telehealth: Payer: Self-pay | Admitting: Hematology & Oncology

## 2013-04-12 NOTE — Telephone Encounter (Addendum)
Message copied by Cathi Roan on Tue Apr 12, 2013  3:47 PM ------      Message from: Josph Macho      Created: Fri Mar 18, 2013  6:45 PM       Call - Vit D is low!!  Is he taking anything!!  Testosterone is low!  Does he want anything for this??  Cindee Lame  04-12-13  Called and spoke to patient today, states he had received a call about his lab results and is now taking Vitamin D tab 1 daily.  Lupita Raider LPN ------

## 2013-06-14 ENCOUNTER — Ambulatory Visit (HOSPITAL_BASED_OUTPATIENT_CLINIC_OR_DEPARTMENT_OTHER): Payer: Managed Care, Other (non HMO) | Admitting: Hematology & Oncology

## 2013-06-14 ENCOUNTER — Other Ambulatory Visit (HOSPITAL_BASED_OUTPATIENT_CLINIC_OR_DEPARTMENT_OTHER): Payer: Managed Care, Other (non HMO) | Admitting: Lab

## 2013-06-14 VITALS — BP 118/72 | HR 75 | Temp 97.6°F | Resp 18 | Ht 75.0 in | Wt 172.0 lb

## 2013-06-14 DIAGNOSIS — C8330 Diffuse large B-cell lymphoma, unspecified site: Secondary | ICD-10-CM

## 2013-06-14 DIAGNOSIS — C8589 Other specified types of non-Hodgkin lymphoma, extranodal and solid organ sites: Secondary | ICD-10-CM

## 2013-06-14 LAB — CBC WITH DIFFERENTIAL (CANCER CENTER ONLY)
BASO#: 0.1 10*3/uL (ref 0.0–0.2)
BASO%: 1.1 % (ref 0.0–2.0)
HCT: 42.9 % (ref 38.7–49.9)
HGB: 14.9 g/dL (ref 13.0–17.1)
LYMPH#: 2.3 10*3/uL (ref 0.9–3.3)
LYMPH%: 50 % — ABNORMAL HIGH (ref 14.0–48.0)
MCHC: 34.7 g/dL (ref 32.0–35.9)
MCV: 92 fL (ref 82–98)
MONO#: 0.6 10*3/uL (ref 0.1–0.9)
NEUT%: 31.5 % — ABNORMAL LOW (ref 40.0–80.0)
RDW: 12.6 % (ref 11.1–15.7)
WBC: 4.7 10*3/uL (ref 4.0–10.0)

## 2013-06-14 NOTE — Progress Notes (Signed)
This office note has been dictated.

## 2013-06-15 LAB — TESTOSTERONE: Testosterone: 214 ng/dL — ABNORMAL LOW (ref 300–890)

## 2013-06-15 NOTE — Progress Notes (Signed)
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  CURRENT THERAPY:  Observation.  INTERIM HISTORY:  [name] comes in for his followup.  He is under stress at work.  His ability to think and focus is somewhat decreased. I suspect this is still from chemotherapy.  I suspect this might last for another year or more. He has had no fevers.  He has had no cough.  He has had no problems with bowels or bladder.  He has had no problems with leg swelling.  He has no bleeding.  He nicked his left lower leg with a chainsaw.  This required sutures.  This thankfully, did not get infected.  He is not taking his vitamin D regularly.  He is not on aspirin.  I told him to take 1 baby aspirin a day.  PHYSICAL EXAMINATION:  General:  This is a well-developed, well- nourished white gentleman in no obvious distress.  Vital signs: Temperature of 97.6, pulse 75, respiratory rate 18, blood pressure 118/72.  Weight is 172.  Head and neck:  Normocephalic, atraumatic skull.  There are no ocular or oral lesions.  There are no palpable cervical or supraclavicular lymph nodes.  Lungs:  Clear bilaterally. Cardiac:  Regular rate and rhythm with a normal S1, S2.  There are no murmurs, rubs or bruits.  Abdomen:  Soft with good bowel sounds.  There is no fluid wave.  There is no palpable hepatosplenomegaly. Extremities:  Show no clubbing, cyanosis or edema.  Neurological:  Shows no focal neurological deficit.  LABORATORY STUDIES:  White cell count is 4.7, hemoglobin 14.9, hematocrit 42.9, platelet count 175.  IMPRESSION:  [name] is a 67 year old gentleman with history of diffuse large cell non-Hodgkin's lymphoma.  He got 6 cycles of systemic chemotherapy with good R-CHOP.  He completed this I think in 04/2012. He then got radiation therapy to the scrotal region.  He also got 1 dose of intrathecal chemotherapy.  Again, he is doing okay.  He is having some memory issues.   Again, this is not surprising.  We will plan to get him back to see [place] in another 3-4 months.  I do not see that we need to do any scans on him.  I do not see any evidence of clinical recurrence.    ______________________________ Josph Macho, M.D. PRE/MEDQ  D:  06/14/2013  T:  06/15/2013  Job:  [id number]

## 2013-06-16 NOTE — Letter (Signed)
June 15, 2013    [employer] rights manager, Attention:  Payton Mccallum excuse request [address] [place], [place] [address].  NAME:  TREMEL, SETTERS MRN:  [id number] DOB:  05/22/1946  RE:  Husein Guedes  Dear Milford Cage or Madam:  This letter is in regards to the jury summons that Mr. Suleyman Ehrman received recently.  His juror number is [id number].  He is requested to report for jury duty at the [facility] courthouse on August 18.  I respectfully request an excuse for Mr. Halls.  He has just completed aggressive chemotherapy and radiation therapy for lymphoma.  Because of his treatments, he is quite fatigued.  He is having some memory difficulties, which is very common with chemotherapy for lymphoma.  I just do not believe that he is mentally and physically able to participate in jury duty.  He likely will need a good 2 years of recovery before he is physically and mentally able to participate in a trial if selected.  Mr. Woolverton recognizes the importance of his civic duty and being on a jury.  However, I just feel that he would be incredibly stressed by a trial right now and that he would not be able to function adequately to serve the judicial process properly.  Again, I respectfully request an excuse for Mr. Troop.  Again, I believe that he will need a good 2 years until he can recover adequately in order to participated in the judicial process.  If there is any medical information that is needed from our office, please feel free to give me a call at [phone].  Respectfully,    Josph Macho, M.D.  PRE/MEDQ  D:  06/15/2013  T:  06/16/2013  Job:  [id number]

## 2013-06-22 ENCOUNTER — Encounter: Payer: Self-pay | Admitting: *Deleted

## 2013-07-27 ENCOUNTER — Telehealth: Payer: Self-pay | Admitting: Hematology & Oncology

## 2013-07-27 NOTE — Telephone Encounter (Signed)
Pt aware moved 10-29 to 10-30

## 2013-09-14 ENCOUNTER — Ambulatory Visit: Payer: Managed Care, Other (non HMO) | Admitting: Hematology & Oncology

## 2013-09-14 ENCOUNTER — Other Ambulatory Visit: Payer: Managed Care, Other (non HMO) | Admitting: Lab

## 2013-09-15 ENCOUNTER — Ambulatory Visit (HOSPITAL_BASED_OUTPATIENT_CLINIC_OR_DEPARTMENT_OTHER): Payer: Managed Care, Other (non HMO) | Admitting: Hematology & Oncology

## 2013-09-15 ENCOUNTER — Other Ambulatory Visit (HOSPITAL_BASED_OUTPATIENT_CLINIC_OR_DEPARTMENT_OTHER): Payer: Managed Care, Other (non HMO) | Admitting: Lab

## 2013-09-15 VITALS — BP 128/44 | HR 70 | Temp 97.7°F | Resp 18 | Ht 72.0 in | Wt 172.0 lb

## 2013-09-15 DIAGNOSIS — C8330 Diffuse large B-cell lymphoma, unspecified site: Secondary | ICD-10-CM

## 2013-09-15 DIAGNOSIS — C8589 Other specified types of non-Hodgkin lymphoma, extranodal and solid organ sites: Secondary | ICD-10-CM

## 2013-09-15 LAB — CBC WITH DIFFERENTIAL (CANCER CENTER ONLY)
BASO#: 0 10*3/uL (ref 0.0–0.2)
BASO%: 0.9 % (ref 0.0–2.0)
Eosinophils Absolute: 0.2 10*3/uL (ref 0.0–0.5)
HCT: 41.7 % (ref 38.7–49.9)
HGB: 14.4 g/dL (ref 13.0–17.1)
LYMPH#: 1.6 10*3/uL (ref 0.9–3.3)
LYMPH%: 34.9 % (ref 14.0–48.0)
MCV: 92 fL (ref 82–98)
MONO#: 0.5 10*3/uL (ref 0.1–0.9)
NEUT%: 48.7 % (ref 40.0–80.0)
RBC: 4.53 10*6/uL (ref 4.20–5.70)
RDW: 12.5 % (ref 11.1–15.7)
WBC: 4.6 10*3/uL (ref 4.0–10.0)

## 2013-09-15 LAB — CMP (CANCER CENTER ONLY)
AST: 19 U/L (ref 11–38)
Albumin: 3.8 g/dL (ref 3.3–5.5)
BUN, Bld: 12 mg/dL (ref 7–22)
CO2: 29 mEq/L (ref 18–33)
Calcium: 8.8 mg/dL (ref 8.0–10.3)
Chloride: 103 mEq/L (ref 98–108)
Creat: 0.9 mg/dl (ref 0.6–1.2)
Glucose, Bld: 100 mg/dL (ref 73–118)
Potassium: 4.2 mEq/L (ref 3.3–4.7)

## 2013-09-15 NOTE — Progress Notes (Signed)
This office note has been dictated.

## 2013-09-16 NOTE — Progress Notes (Signed)
CC:   Lupita Raider, M.D.  DIAGNOSIS:  Diffuse large cell non-Hodgkin lymphoma of the left testicle -- clinical remission.  CURRENT THERAPY:  Observation.  INTERIM HISTORY:  Mr. Velazco comes in for followup.  We last saw him back in July.  He is doing well.  He has had no specific complaints.  He has had no nausea and vomiting.  There has been no abdominal pain.  He has had no issues with his bowels or bladder.  He has not noted any rashes.  He is working without any difficulty.  He has not noted any issues with bleeding.  He does take a baby aspirin.  He does take vitamin D.  PHYSICAL EXAMINATION:  General:  This is a well-developed, well- nourished white gentleman in no obvious distress.  Vital signs: Temperature of 97.7, pulse 70, respiratory rate 18, blood pressure 128/44.  Weight is 172 pounds.  Head and Neck:  Normocephalic, atraumatic skull.  There are no ocular or oral lesions.  There are no palpable cervical or supraclavicular lymph nodes.  Lungs:  Clear bilaterally.  Cardiac:  Regular rate and rhythm with a normal S1 and S2. There are no murmurs, rubs, or bruits.  Abdomen:  Soft.  He has good bowel sounds.  There is no fluid wave.  There is no palpable abdominal mass.  There is no palpable hepatosplenomegaly.  Extremities:  No clubbing, cyanosis, or edema.  Neurological:  No focal neurological deficits.  LABORATORY STUDIES:  White cell count is 4.6, hemoglobin 14.4, hematocrit 41.7, platelet count 161.  Sodium 141, potassium 4.3, BUN 12, creatinine 0.9.  IMPRESSION:  Mr. Pettie is a 67 year old gentleman.  He had diffuse large cell lymphoma of the left testicle.  He underwent orchiectomy.  We gave him systemic chemotherapy with 6 cycles of R-CHOP.  He then followed this up with radiation therapy.  We tried to give him intrathecal chemotherapy, but he did not do too well with this.  He is in remission.  I do not see a need for any scans on him.  We will plan to get  him back in I think 4 months now.  He completed his chemotherapy back in I think June 2013.   ______________________________ Josph Macho, M.D. PRE/MEDQ  D:  09/15/2013  T:  09/16/2013  Job:  [id number]

## 2014-01-16 ENCOUNTER — Other Ambulatory Visit: Payer: Managed Care, Other (non HMO) | Admitting: Lab

## 2014-01-16 ENCOUNTER — Telehealth: Payer: Self-pay | Admitting: Hematology & Oncology

## 2014-01-16 ENCOUNTER — Ambulatory Visit: Payer: Managed Care, Other (non HMO) | Admitting: Hematology & Oncology

## 2014-01-16 NOTE — Telephone Encounter (Signed)
Patient called and cx 01/16/14 apt and stated he will call back to resch.  Manuela Schwartz was notified of cx apt

## 2014-02-08 ENCOUNTER — Telehealth: Payer: Self-pay | Admitting: Hematology & Oncology

## 2014-02-08 NOTE — Telephone Encounter (Signed)
Pt made 4-28 appointment. He did not want to see MD and lab on separate days

## 2014-03-14 ENCOUNTER — Other Ambulatory Visit (HOSPITAL_BASED_OUTPATIENT_CLINIC_OR_DEPARTMENT_OTHER): Payer: Managed Care, Other (non HMO) | Admitting: Lab

## 2014-03-14 ENCOUNTER — Ambulatory Visit (HOSPITAL_BASED_OUTPATIENT_CLINIC_OR_DEPARTMENT_OTHER): Payer: Managed Care, Other (non HMO) | Admitting: Hematology & Oncology

## 2014-03-14 ENCOUNTER — Encounter: Payer: Self-pay | Admitting: Hematology & Oncology

## 2014-03-14 VITALS — BP 129/90 | HR 90 | Temp 97.5°F | Resp 14 | Ht 69.0 in | Wt 177.0 lb

## 2014-03-14 DIAGNOSIS — C8330 Diffuse large B-cell lymphoma, unspecified site: Secondary | ICD-10-CM

## 2014-03-14 DIAGNOSIS — C8589 Other specified types of non-Hodgkin lymphoma, extranodal and solid organ sites: Secondary | ICD-10-CM

## 2014-03-14 LAB — CBC WITH DIFFERENTIAL (CANCER CENTER ONLY)
BASO#: 0 10*3/uL (ref 0.0–0.2)
BASO%: 0.7 % (ref 0.0–2.0)
EOS%: 5 % (ref 0.0–7.0)
Eosinophils Absolute: 0.2 10*3/uL (ref 0.0–0.5)
HCT: 42.5 % (ref 38.7–49.9)
HEMOGLOBIN: 15 g/dL (ref 13.0–17.1)
LYMPH#: 1.9 10*3/uL (ref 0.9–3.3)
LYMPH%: 45.5 % (ref 14.0–48.0)
MCH: 32.1 pg (ref 28.0–33.4)
MCHC: 35.3 g/dL (ref 32.0–35.9)
MCV: 91 fL (ref 82–98)
MONO#: 0.6 10*3/uL (ref 0.1–0.9)
MONO%: 13 % (ref 0.0–13.0)
NEUT#: 1.5 10*3/uL (ref 1.5–6.5)
NEUT%: 35.8 % — AB (ref 40.0–80.0)
PLATELETS: 166 10*3/uL (ref 145–400)
RBC: 4.67 10*6/uL (ref 4.20–5.70)
RDW: 12.2 % (ref 11.1–15.7)
WBC: 4.2 10*3/uL (ref 4.0–10.0)

## 2014-03-14 LAB — COMPREHENSIVE METABOLIC PANEL
ALK PHOS: 89 U/L (ref 39–117)
ALT: 24 U/L (ref 0–53)
AST: 16 U/L (ref 0–37)
Albumin: 4.4 g/dL (ref 3.5–5.2)
BUN: 13 mg/dL (ref 6–23)
CO2: 26 mEq/L (ref 19–32)
CREATININE: 0.9 mg/dL (ref 0.50–1.35)
Calcium: 9.5 mg/dL (ref 8.4–10.5)
Chloride: 103 mEq/L (ref 96–112)
Glucose, Bld: 91 mg/dL (ref 70–99)
Potassium: 3.9 mEq/L (ref 3.5–5.3)
Sodium: 138 mEq/L (ref 135–145)
Total Bilirubin: 0.4 mg/dL (ref 0.2–1.2)
Total Protein: 6.7 g/dL (ref 6.0–8.3)

## 2014-03-14 LAB — LACTATE DEHYDROGENASE: LDH: 141 U/L (ref 94–250)

## 2014-03-14 NOTE — Patient Instructions (Signed)
You Can Quit Smoking If you are ready to quit smoking or are thinking about it, congratulations! You have chosen to help yourself be healthier and live longer! There are lots of different ways to quit smoking. Nicotine gum, nicotine patches, a nicotine inhaler, or nicotine nasal spray can help with physical craving. Hypnosis, support groups, and medicines help break the habit of smoking. TIPS TO GET OFF AND STAY OFF CIGARETTES  Learn to predict your moods. Do not let a bad situation be your excuse to have a cigarette. Some situations in your life might tempt you to have a cigarette.  Ask friends and co-workers not to smoke around you.  Make your home smoke-free.  Never have "just one" cigarette. It leads to wanting another and another. Remind yourself of your decision to quit.  On a card, make a list of your reasons for not smoking. Read it at least the same number of times a day as you have a cigarette. Tell yourself everyday, "I do not want to smoke. I choose not to smoke."  Ask someone at home or work to help you with your plan to quit smoking.  Have something planned after you eat or have a cup of coffee. Take a walk or get other exercise to perk you up. This will help to keep you from overeating.  Try a relaxation exercise to calm you down and decrease your stress. Remember, you may be tense and nervous the first two weeks after you quit. This will pass.  Find new activities to keep your hands busy. Play with a pen, coin, or rubber band. Doodle or draw things on paper.  Brush your teeth right after eating. This will help cut down the craving for the taste of tobacco after meals. You can try mouthwash too.  Try gum, breath mints, or diet candy to keep something in your mouth. IF YOU SMOKE AND WANT TO QUIT:  Do not stock up on cigarettes. Never buy a carton. Wait until one pack is finished before you buy another.  Never carry cigarettes with you at work or at home.  Keep cigarettes  as far away from you as possible. Leave them with someone else.  Never carry matches or a lighter with you.  Ask yourself, "Do I need this cigarette or is this just a reflex?"  Bet with someone that you can quit. Put cigarette money in a piggy bank every morning. If you smoke, you give up the money. If you do not smoke, by the end of the week, you keep the money.  Keep trying. It takes 21 days to change a habit!  Talk to your doctor about using medicines to help you quit. These include nicotine replacement gum, lozenges, or skin patches. Document Released: 08/30/2009 Document Revised: 01/26/2012 Document Reviewed: 08/30/2009 [employer] Patient Information 2014 [employer].  

## 2014-03-15 ENCOUNTER — Telehealth: Payer: Self-pay | Admitting: Hematology & Oncology

## 2014-03-15 ENCOUNTER — Telehealth: Payer: Self-pay | Admitting: *Deleted

## 2014-03-15 NOTE — Telephone Encounter (Signed)
Mailed 08-2014 schedule °

## 2014-03-15 NOTE — Progress Notes (Signed)
Hematology and Oncology Follow Up Visit  [name] [id number] 09/08/1946 68 y.o. 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  Henson is 68 year old who is back for followup. He's doing okay. I last saw him back in October. He noted some dizziness this morning. He's not had this before. There is no vertigo. He did not pass out. This lasted a few seconds.  He's had no headache. He's had no weight loss weight gain. He still working. He's had no change in medications. He really hates taking medicine. Has been no change in bowel or bladder habits.  He's had no rashes. He's had no joint swelling. He was on some right elbow problems. At the med had bursitis. This is improved.  Medications: Current outpatient prescriptions:aspirin 81 MG tablet, Take 81 mg by mouth daily. Pt states he takes occ., Disp: , Rfl: ;  cholecalciferol (VITAMIN D) 1000 UNITS tablet, Take 1,000 Units by mouth daily., Disp: , Rfl: ;  [DISCONTINUED] prochlorperazine (COMPAZINE) 10 MG tablet, Take 1 tablet (10 mg total) by mouth every 6 (six) hours as needed (Nausea or vomiting)., Disp: 30 tablet, Rfl: 6  Allergies:  Allergies  Allergen Reactions  . Morphine And Related Itching  . Motrin [Ibuprofen] Nausea Only    Past Medical History, Surgical history, Social history, and Family History were reviewed and updated.  Review of Systems: As above  Physical Exam:  height is 5\' 9"  (1.753 m) and weight is 177 lb (80.287 kg). His oral temperature is 97.5 F (36.4 C). His blood pressure is 129/90 and his pulse is 90. His respiration is 14.   Well-developed and well-nourished white gentleman. Head and neck exam shows no nystagmus. Pupils reacted properly. No oral lesion. Oral mucosa is moist. Neck is supple with no adenopathy. Thyroid is nonpalpable. Lungs are clear. Cardiac exam regular in rhythm with  no murmurs rubs or bruits. Abdomen soft. Has good bowel sounds. There is no fluid wave. There is a palpable abdominal mass. He has well-healed left inguinal orchiectomy scar. Extremities no clubbing cyanosis or edema. Has good strength in his muscles. He has no joint swelling. Neurological exam shows no focal neurological deficits.  Lab Results  Component Value Date   WBC 4.2 03/14/2014   HGB 15.0 03/14/2014   HCT 42.5 03/14/2014   MCV 91 03/14/2014   PLT 166 03/14/2014     Chemistry      Component Value Date/Time   NA 138 03/14/2014 0845   NA 141 09/15/2013 1054   K 3.9 03/14/2014 0845   K 4.2 09/15/2013 1054   CL 103 03/14/2014 0845   CL 103 09/15/2013 1054   CO2 26 03/14/2014 0845   CO2 29 09/15/2013 1054   BUN 13 03/14/2014 0845   BUN 12 09/15/2013 1054   CREATININE 0.90 03/14/2014 0845   CREATININE 0.9 09/15/2013 1054      Component Value Date/Time   CALCIUM 9.5 03/14/2014 0845   CALCIUM 8.8 09/15/2013 1054   ALKPHOS 89 03/14/2014 0845   ALKPHOS 83 09/15/2013 1054   AST 16 03/14/2014 0845   AST 19 09/15/2013 1054   ALT 24 03/14/2014 0845   ALT 19 09/15/2013 1054   BILITOT 0.4 03/14/2014 0845   BILITOT 0.70 09/15/2013 1054         Impression and Plan: Mr. Belote is 68 year old gentleman. He has a  history of testicular lymphoma. He was treated with systemic chemotherapy with 6 cycles of R.-CHOP. He then had radiation therapy to the scrotal region. We then 5 him on intrathecal chemotherapy because of the risk of relapse into the CNS with testicular lymphoma.  Am not sure what his dizziness is coming from. It seems very transient. There's no neurological issues that I can find on his exam. I don't think this would be an indication for a couple of MRI or CT scan.  If this dizziness does continue, or if he develops any additional symptoms such as double vision, slurred speech, asymmetric weakness, he is to let us know. At that point, I would then have to consider a in-depth neurological  evaluation to make sure that there is no relapse of his lymphoma.  Has been 2 years since he completed his chemotherapy and radiation therapy.  I will get him back in 6 months unless something happens sooner.  I spent over 30 minutes with him today trying to get around this dizziness issue.   Volanda Napoleon, MD 4/29/20156:51 AM

## 2014-03-15 NOTE — Telephone Encounter (Addendum)
Message copied by Lenn Sink on Wed Mar 15, 2014 10:36 AM ------      Message from: Volanda Napoleon      Created: Tue Mar 14, 2014  8:49 PM       Call - labs are normal. Laurey Arrow ------Informed pt that labs were normal.

## 2014-08-08 ENCOUNTER — Telehealth: Payer: Self-pay | Admitting: Hematology & Oncology

## 2014-08-08 NOTE — Telephone Encounter (Signed)
Patient called and cx 09/12/14 lab and resch it for 09/11/14

## 2014-09-11 ENCOUNTER — Other Ambulatory Visit (HOSPITAL_BASED_OUTPATIENT_CLINIC_OR_DEPARTMENT_OTHER): Payer: Managed Care, Other (non HMO) | Admitting: Lab

## 2014-09-11 DIAGNOSIS — C8330 Diffuse large B-cell lymphoma, unspecified site: Secondary | ICD-10-CM

## 2014-09-11 DIAGNOSIS — C8595 Non-Hodgkin lymphoma, unspecified, lymph nodes of inguinal region and lower limb: Secondary | ICD-10-CM

## 2014-09-11 LAB — CMP (CANCER CENTER ONLY)
ALBUMIN: 3.7 g/dL (ref 3.3–5.5)
ALT: 27 U/L (ref 10–47)
AST: 22 U/L (ref 11–38)
Alkaline Phosphatase: 79 U/L (ref 26–84)
BUN: 11 mg/dL (ref 7–22)
CHLORIDE: 99 meq/L (ref 98–108)
CO2: 25 meq/L (ref 18–33)
Calcium: 9.3 mg/dL (ref 8.0–10.3)
Creat: 0.9 mg/dl (ref 0.6–1.2)
Glucose, Bld: 112 mg/dL (ref 73–118)
Potassium: 3.7 mEq/L (ref 3.3–4.7)
Sodium: 140 mEq/L (ref 128–145)
Total Bilirubin: 0.6 mg/dl (ref 0.20–1.60)
Total Protein: 6.9 g/dL (ref 6.4–8.1)

## 2014-09-11 LAB — CBC WITH DIFFERENTIAL (CANCER CENTER ONLY)
BASO#: 0 10*3/uL (ref 0.0–0.2)
BASO%: 0.8 % (ref 0.0–2.0)
EOS ABS: 0.2 10*3/uL (ref 0.0–0.5)
EOS%: 3.9 % (ref 0.0–7.0)
HCT: 41.2 % (ref 38.7–49.9)
HGB: 14.4 g/dL (ref 13.0–17.1)
LYMPH#: 1.7 10*3/uL (ref 0.9–3.3)
LYMPH%: 32.7 % (ref 14.0–48.0)
MCH: 31.9 pg (ref 28.0–33.4)
MCHC: 35 g/dL (ref 32.0–35.9)
MCV: 91 fL (ref 82–98)
MONO#: 0.4 10*3/uL (ref 0.1–0.9)
MONO%: 7.9 % (ref 0.0–13.0)
NEUT#: 2.8 10*3/uL (ref 1.5–6.5)
NEUT%: 54.7 % (ref 40.0–80.0)
Platelets: 168 10*3/uL (ref 145–400)
RBC: 4.52 10*6/uL (ref 4.20–5.70)
RDW: 12.6 % (ref 11.1–15.7)
WBC: 5.1 10*3/uL (ref 4.0–10.0)

## 2014-09-11 LAB — LACTATE DEHYDROGENASE: LDH: 151 U/L (ref 94–250)

## 2014-09-12 ENCOUNTER — Ambulatory Visit (HOSPITAL_BASED_OUTPATIENT_CLINIC_OR_DEPARTMENT_OTHER): Payer: Managed Care, Other (non HMO) | Admitting: Hematology & Oncology

## 2014-09-12 ENCOUNTER — Other Ambulatory Visit: Payer: Managed Care, Other (non HMO) | Admitting: Lab

## 2014-09-12 DIAGNOSIS — C8330 Diffuse large B-cell lymphoma, unspecified site: Secondary | ICD-10-CM

## 2014-09-12 DIAGNOSIS — C8336 Diffuse large B-cell lymphoma, intrapelvic lymph nodes: Secondary | ICD-10-CM

## 2014-09-13 NOTE — Progress Notes (Signed)
Hematology and Oncology Follow Up Visit  [name] [id number] 12-Oct-1946 68 y.o. 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 He's doing okay. I last saw him back in April. He has been doing pretty well. He had a good summer. He has had some issues with his joints. I think he may have had some bursitis with his right elbow.  He's had no problem with fever. He's had no rashes. There's been no leg swelling. He's had no change in bowel or bladder habits. His appetite has been good.  He's had no headaches. He has not noted any palpable lymph glands.  Overall, his performance status is ECOG 0.  Medications: Current outpatient prescriptions:aspirin 81 MG tablet, Take 81 mg by mouth daily. Pt states he takes occ., Disp: , Rfl: ;  cholecalciferol (VITAMIN D) 1000 UNITS tablet, Take 1,000 Units by mouth daily., Disp: , Rfl: ;  [DISCONTINUED] prochlorperazine (COMPAZINE) 10 MG tablet, Take 1 tablet (10 mg total) by mouth every 6 (six) hours as needed (Nausea or vomiting)., Disp: 30 tablet, Rfl: 6  Allergies:  Allergies  Allergen Reactions  . Morphine And Related Itching  . Motrin [Ibuprofen] Nausea Only    Past Medical History, Surgical history, Social history, and Family History were reviewed and updated.  Review of Systems: As above  Physical Exam:  vitals were not taken for this visit.  Well-developed and well-nourished white gentleman. His pulse is 78. His respiratory rate is 14. His blood pressure is 118/76. His weight was not taken. Head and neck exam shows no nystagmus. Pupils reacted properly. No oral lesion. Oral mucosa is moist. Neck is supple with no adenopathy. Thyroid is nonpalpable. Lungs are clear. Cardiac exam regular in rhythm with no murmurs rubs or bruits. Abdomen soft. Has good bowel sounds. There  is no fluid wave. There is no palpable abdominal mass. He has no palpable liver or spleen tip. He has well-healed left inguinal orchiectomy scar. Extremities no clubbing cyanosis or edema. Has good strength in his muscles. He has no joint swelling. Neurological exam shows no focal neurological deficits. Skin exam shows no rashes, ecchymoses or petechia. Lab Results  Component Value Date   WBC 5.1 09/11/2014   HGB 14.4 09/11/2014   HCT 41.2 09/11/2014   MCV 91 09/11/2014   PLT 168 09/11/2014     Chemistry      Component Value Date/Time   NA 140 09/11/2014 1445   NA 138 03/14/2014 0845   K 3.7 09/11/2014 1445   K 3.9 03/14/2014 0845   CL 99 09/11/2014 1445   CL 103 03/14/2014 0845   CO2 25 09/11/2014 1445   CO2 26 03/14/2014 0845   BUN 11 09/11/2014 1445   BUN 13 03/14/2014 0845   CREATININE 0.9 09/11/2014 1445   CREATININE 0.90 03/14/2014 0845      Component Value Date/Time   CALCIUM 9.3 09/11/2014 1445   CALCIUM 9.5 03/14/2014 0845   ALKPHOS 79 09/11/2014 1445   ALKPHOS 89 03/14/2014 0845   AST 22 09/11/2014 1445   AST 16 03/14/2014 0845   ALT 27 09/11/2014 1445   ALT 24 03/14/2014 0845   BILITOT 0.60 09/11/2014 1445   BILITOT 0.4 03/14/2014 0845         Impression and Plan: [name] is 68 year old gentleman. He  has a history of testicular lymphoma. He was treated with systemic chemotherapy with 6 cycles of R.-CHOP. he completed this in May 2013.  He then had radiation therapy to the scrotal region. We then  tried him on intrathecal chemotherapy because of the risk of relapse into the CNS with testicular lymphoma. he did very poorly with this. I think he may have gotten one or 2 treatments.  Has been over  2 years since he completed his chemotherapy and radiation therapy. I think that his risk of relapse at this point is probably less than 15%.  I will get him back in 6 months unless something happens sooner. I told him to make sure he takes vitamin D. He does take baby aspirin.  I told him to take 2000 units of vitamin D daily.  Volanda Napoleon, MD 10/28/20157:35 AM

## 2014-12-19 ENCOUNTER — Other Ambulatory Visit: Payer: Self-pay | Admitting: Family

## 2015-03-12 ENCOUNTER — Other Ambulatory Visit (HOSPITAL_BASED_OUTPATIENT_CLINIC_OR_DEPARTMENT_OTHER): Payer: Managed Care, Other (non HMO)

## 2015-03-12 DIAGNOSIS — C8330 Diffuse large B-cell lymphoma, unspecified site: Secondary | ICD-10-CM | POA: Diagnosis not present

## 2015-03-12 LAB — CBC WITH DIFFERENTIAL (CANCER CENTER ONLY)
BASO#: 0.1 10*3/uL (ref 0.0–0.2)
BASO%: 1 % (ref 0.0–2.0)
EOS%: 4.2 % (ref 0.0–7.0)
Eosinophils Absolute: 0.2 10*3/uL (ref 0.0–0.5)
HEMATOCRIT: 41.5 % (ref 38.7–49.9)
HEMOGLOBIN: 14.4 g/dL (ref 13.0–17.1)
LYMPH#: 1.8 10*3/uL (ref 0.9–3.3)
LYMPH%: 35.3 % (ref 14.0–48.0)
MCH: 31.6 pg (ref 28.0–33.4)
MCHC: 34.7 g/dL (ref 32.0–35.9)
MCV: 91 fL (ref 82–98)
MONO#: 0.4 10*3/uL (ref 0.1–0.9)
MONO%: 8 % (ref 0.0–13.0)
NEUT#: 2.6 10*3/uL (ref 1.5–6.5)
NEUT%: 51.5 % (ref 40.0–80.0)
PLATELETS: 206 10*3/uL (ref 145–400)
RBC: 4.56 10*6/uL (ref 4.20–5.70)
RDW: 12.6 % (ref 11.1–15.7)
WBC: 5 10*3/uL (ref 4.0–10.0)

## 2015-03-12 LAB — CMP (CANCER CENTER ONLY)
ALBUMIN: 3.8 g/dL (ref 3.3–5.5)
ALT(SGPT): 32 U/L (ref 10–47)
AST: 26 U/L (ref 11–38)
Alkaline Phosphatase: 83 U/L (ref 26–84)
BILIRUBIN TOTAL: 0.7 mg/dL (ref 0.20–1.60)
BUN, Bld: 14 mg/dL (ref 7–22)
CO2: 29 mEq/L (ref 18–33)
CREATININE: 1 mg/dL (ref 0.6–1.2)
Calcium: 9.5 mg/dL (ref 8.0–10.3)
Chloride: 102 mEq/L (ref 98–108)
Glucose, Bld: 124 mg/dL — ABNORMAL HIGH (ref 73–118)
Potassium: 4 mEq/L (ref 3.3–4.7)
Sodium: 141 mEq/L (ref 128–145)
Total Protein: 7.2 g/dL (ref 6.4–8.1)

## 2015-03-13 ENCOUNTER — Encounter: Payer: Self-pay | Admitting: Family

## 2015-03-13 ENCOUNTER — Telehealth: Payer: Self-pay | Admitting: *Deleted

## 2015-03-13 ENCOUNTER — Ambulatory Visit (HOSPITAL_BASED_OUTPATIENT_CLINIC_OR_DEPARTMENT_OTHER): Payer: Managed Care, Other (non HMO) | Admitting: Family

## 2015-03-13 VITALS — BP 133/69 | HR 74 | Temp 97.3°F | Resp 18 | Ht 69.0 in | Wt 178.0 lb

## 2015-03-13 DIAGNOSIS — C8330 Diffuse large B-cell lymphoma, unspecified site: Secondary | ICD-10-CM

## 2015-03-13 DIAGNOSIS — C8336 Diffuse large B-cell lymphoma, intrapelvic lymph nodes: Secondary | ICD-10-CM | POA: Diagnosis not present

## 2015-03-13 DIAGNOSIS — C8589 Other specified types of non-Hodgkin lymphoma, extranodal and solid organ sites: Secondary | ICD-10-CM

## 2015-03-13 DIAGNOSIS — D4959 Neoplasm of unspecified behavior of other genitourinary organ: Secondary | ICD-10-CM

## 2015-03-13 LAB — VITAMIN D 25 HYDROXY (VIT D DEFICIENCY, FRACTURES): Vit D, 25-Hydroxy: 15 ng/mL — ABNORMAL LOW (ref 30–100)

## 2015-03-13 NOTE — Telephone Encounter (Signed)
-----   Message from Volanda Napoleon, MD sent at 03/13/2015  7:31 AM EDT ----- Call - vit D is very low!!  Is he taking any???  Laurey Arrow

## 2015-03-13 NOTE — Progress Notes (Signed)
Hematology and Oncology Follow Up Visit  [name] [id number] Jan 28, 1946 69 y.o. 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 [name] is doing well and has no complaints at this time. His vitamin D level is low at 15. [name] has not been taking his supplement. I told him [name] needed to take it every ay. [name] said [name] will start today. [name] has had no issues with infections. No fever, chills, n/v, cough, rash, dizziness, SOB, chest pain, palpitations, abdominal pain, problems urinating, constipation, diarrhea, blood in urine or stool. No lymphadenopathy.  No swelling, tenderness, numbness or tingling in his extremities. No new aches or pains.  His appetite is good and [name] is staying hydrated. His weight is stable.  [name] has been spending time working in his yard, enjoying the nice weather.   Medications:    Medication List       This list is accurate as of: 03/13/15 10:30 AM.  Always use your most recent med list.               aspirin 81 MG tablet  Take 81 mg by mouth daily. Pt states [name] takes occ.     cholecalciferol 1000 UNITS tablet  Commonly known as:  VITAMIN D  Take 1,000 Units by mouth daily.        Allergies:  Allergies  Allergen Reactions  . Morphine And Related Itching  . Motrin [Ibuprofen] Nausea Only    Past Medical History, Surgical history, Social history, and Family History were reviewed and updated.  Review of Systems: All other 10 point review of systems is negative.   Physical Exam:  vitals were not taken for this visit.  Wt Readings from Last 3 Encounters:  03/14/14 177 lb (80.287 kg)  09/15/13 172 lb (78.019 kg)  06/14/13 172 lb (78.019 kg)    Ocular: Sclerae unicteric, pupils equal, round and reactive to light Ear-nose-throat: Oropharynx clear, dentition fair Lymphatic: No cervical or  supraclavicular adenopathy Lungs no rales or rhonchi, good excursion bilaterally Heart regular rate and rhythm, no murmur appreciated Abd soft, nontender, positive bowel sounds MSK no focal spinal tenderness, no joint edema Neuro: non-focal, well-oriented, appropriate affect  Lab Results  Component Value Date   WBC 5.0 03/12/2015   HGB 14.4 03/12/2015   HCT 41.5 03/12/2015   MCV 91 03/12/2015   PLT 206 03/12/2015   No results found for: FERRITIN, IRON, TIBC, UIBC, IRONPCTSAT Lab Results  Component Value Date   RBC 4.56 03/12/2015   No results found for: KPAFRELGTCHN, LAMBDASER, KAPLAMBRATIO No results found for: IGGSERUM, IGA, IGMSERUM No results found for: Odetta Pink, SPEI   Chemistry      Component Value Date/Time   NA 141 03/12/2015 1439   NA 138 03/14/2014 0845   K 4.0 03/12/2015 1439   K 3.9 03/14/2014 0845   CL 102 03/12/2015 1439   CL 103 03/14/2014 0845   CO2 29 03/12/2015 1439   CO2 26 03/14/2014 0845   BUN 14 03/12/2015 1439   BUN 13 03/14/2014 0845   CREATININE 1.0 03/12/2015 1439   CREATININE 0.90 03/14/2014 0845      Component Value Date/Time   CALCIUM 9.5 03/12/2015 1439   CALCIUM 9.5 03/14/2014 0845   ALKPHOS 83 03/12/2015 1439   ALKPHOS 89 03/14/2014 0845  AST 26 03/12/2015 1439   AST 16 03/14/2014 0845   ALT 32 03/12/2015 1439   ALT 24 03/14/2014 0845   BILITOT 0.70 03/12/2015 1439   BILITOT 0.4 03/14/2014 0845     Impression and Plan: [name] is 69 year old gentleman with a history of testicular lymphoma. [name] was treated with systemic chemotherapy (6 cycles of R.-CHOP) and completed this in May 2013.[name] then had radiation therapy to the scrotal region. [name] tried intrathecal chemotherapy because of the risk of relapse into the CNS with testicular lymphoma but was unable to tolerate it. [name] completed 2 treatments. [name] is now over 2 years out and doing well. [name] is asymptomatic at this time and  has shown no evidence of recurrence.  [name] will start taking his vitamin D 2000 units daily today.  I will get him back in 6 months for labs and follow-up. [name] knows to call here with any questions or concerns. We can certainly see him sooner if need be.   Eliezer Bottom, NP 4/26/201610:30 AM

## 2015-03-14 ENCOUNTER — Telehealth: Payer: Self-pay | Admitting: *Deleted

## 2015-03-14 NOTE — Telephone Encounter (Signed)
-----   Message from Keith Napoleon, MD sent at 03/13/2015  7:31 AM EDT ----- Call - vit D is very low!!  Is he taking any???  Laurey Arrow

## 2015-03-14 NOTE — Telephone Encounter (Signed)
Spoke with patient.  He is not taking any Vitamin D at this time.  Dr. Marin Olp wants patient to take 2000 u Vitamin D daily.  This is affirmed by Dr. Marin Olp

## 2015-09-05 ENCOUNTER — Ambulatory Visit (HOSPITAL_BASED_OUTPATIENT_CLINIC_OR_DEPARTMENT_OTHER): Payer: Managed Care, Other (non HMO) | Admitting: Hematology & Oncology

## 2015-09-05 ENCOUNTER — Encounter: Payer: Self-pay | Admitting: Hematology & Oncology

## 2015-09-05 ENCOUNTER — Other Ambulatory Visit (HOSPITAL_BASED_OUTPATIENT_CLINIC_OR_DEPARTMENT_OTHER): Payer: Managed Care, Other (non HMO)

## 2015-09-05 VITALS — BP 128/71 | HR 72 | Temp 97.4°F | Resp 14 | Ht 69.0 in | Wt 177.0 lb

## 2015-09-05 DIAGNOSIS — C8589 Other specified types of non-Hodgkin lymphoma, extranodal and solid organ sites: Secondary | ICD-10-CM

## 2015-09-05 DIAGNOSIS — C8335 Diffuse large B-cell lymphoma, lymph nodes of inguinal region and lower limb: Secondary | ICD-10-CM

## 2015-09-05 DIAGNOSIS — C8332 Diffuse large B-cell lymphoma, intrathoracic lymph nodes: Secondary | ICD-10-CM

## 2015-09-05 DIAGNOSIS — C8330 Diffuse large B-cell lymphoma, unspecified site: Secondary | ICD-10-CM

## 2015-09-05 DIAGNOSIS — D4959 Neoplasm of unspecified behavior of other genitourinary organ: Secondary | ICD-10-CM

## 2015-09-05 LAB — CBC WITH DIFFERENTIAL (CANCER CENTER ONLY)
BASO#: 0 10*3/uL (ref 0.0–0.2)
BASO%: 1 % (ref 0.0–2.0)
EOS%: 5.9 % (ref 0.0–7.0)
Eosinophils Absolute: 0.2 10*3/uL (ref 0.0–0.5)
HCT: 41.9 % (ref 38.7–49.9)
HGB: 14.4 g/dL (ref 13.0–17.1)
LYMPH#: 1.8 10*3/uL (ref 0.9–3.3)
LYMPH%: 43.1 % (ref 14.0–48.0)
MCH: 31.4 pg (ref 28.0–33.4)
MCHC: 34.4 g/dL (ref 32.0–35.9)
MCV: 91 fL (ref 82–98)
MONO#: 0.5 10*3/uL (ref 0.1–0.9)
MONO%: 12.3 % (ref 0.0–13.0)
NEUT#: 1.5 10*3/uL (ref 1.5–6.5)
NEUT%: 37.7 % — AB (ref 40.0–80.0)
PLATELETS: 169 10*3/uL (ref 145–400)
RBC: 4.59 10*6/uL (ref 4.20–5.70)
RDW: 12.3 % (ref 11.1–15.7)
WBC: 4.1 10*3/uL (ref 4.0–10.0)

## 2015-09-05 LAB — COMPREHENSIVE METABOLIC PANEL (CC13)
ALK PHOS: 84 U/L (ref 40–150)
ALT: 31 U/L (ref 0–55)
AST: 20 U/L (ref 5–34)
Albumin: 4.1 g/dL (ref 3.5–5.0)
Anion Gap: 6 mEq/L (ref 3–11)
BUN: 13.6 mg/dL (ref 7.0–26.0)
CO2: 24 mEq/L (ref 22–29)
Calcium: 9.6 mg/dL (ref 8.4–10.4)
Chloride: 109 mEq/L (ref 98–109)
Creatinine: 0.9 mg/dL (ref 0.7–1.3)
EGFR: 87 mL/min/{1.73_m2} — AB (ref >90)
GLUCOSE: 92 mg/dL (ref 70–140)
POTASSIUM: 4.1 meq/L (ref 3.5–5.1)
SODIUM: 139 meq/L (ref 136–145)
Total Bilirubin: 0.46 mg/dL (ref 0.20–1.20)
Total Protein: 7.1 g/dL (ref 6.4–8.3)

## 2015-09-05 NOTE — Progress Notes (Signed)
Hematology and Oncology Follow Up Visit  [name] [id number] 10/31/1946 69 y.o. 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  Henson is 69 year old who is back for followup. He's doing okay. I last saw him back in April. He has been doing pretty well. He had a good summer. He has had some issues with his joints.   He's had no problem with fever. He's had no rashes. There's been no leg swelling. He's had no change in bowel or bladder habits. His appetite has been good.  His vitamin D level back in April was 15. He now is on 2000 units a day.  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  He is somewhat stressed out. This all has do with the upcoming election.  He has noticed some tremors in the right inguinal region. He has not noted any palpable lymph glands.   Overall, his performance status is ECOG 0.  Medications:  Current outpatient prescriptions:  .  aspirin 81 MG tablet, Take 81 mg by mouth daily. Pt states he takes occ., Disp: , Rfl:  .  cholecalciferol (VITAMIN D) 1000 UNITS tablet, Take 1,000 Units by mouth daily., Disp: , Rfl:  .  [DISCONTINUED] prochlorperazine (COMPAZINE) 10 MG tablet, Take 1 tablet (10 mg total) by mouth every 6 (six) hours as needed (Nausea or vomiting)., Disp: 30 tablet, Rfl: 6  Allergies:  Allergies  Allergen Reactions  . Morphine And Related Itching  . Motrin [Ibuprofen] Nausea Only    Past Medical History, Surgical history, Social history, and Family History were reviewed and updated.  Review of Systems: As above  Physical Exam:  height is 5\' 9"  (1.753 m) and weight is 177 lb (80.287 kg). His oral temperature is 97.4 F (36.3 C). His blood pressure is 128/71 and his pulse is 72. His respiration is 14.   Well-developed and well-nourished  white gentleman. His pulse is 78. His respiratory rate is 14. His blood pressure is 118/76. His weight was not taken. Head and neck exam shows no nystagmus. Pupils reacted properly. No oral lesion. Oral mucosa is moist. Neck is supple with no adenopathy. Thyroid is nonpalpable. Lungs are clear. Cardiac exam regular in rhythm with no murmurs rubs or bruits. Abdomen soft. Has good bowel sounds. There is no fluid wave. There is no palpable abdominal mass. He has no palpable liver or spleen tip. He has well-healed left inguinal orchiectomy scar. Extremities no clubbing cyanosis or edema. Has good strength in his muscles. He has no joint swelling. Neurological exam shows no focal neurological deficits. Skin exam shows no rashes, ecchymoses or petechia.  Lab Results  Component Value Date   WBC 4.1 09/05/2015   HGB 14.4 09/05/2015   HCT 41.9 09/05/2015   MCV 91 09/05/2015   PLT 169 09/05/2015     Chemistry      Component Value Date/Time   NA 141 03/12/2015 1439   NA 138 03/14/2014 0845   K 4.0 03/12/2015 1439   K 3.9 03/14/2014 0845   CL 102 03/12/2015 1439   CL 103 03/14/2014 0845   CO2 29 03/12/2015 1439   CO2 26 03/14/2014 0845   BUN 14 03/12/2015 1439   BUN 13 03/14/2014 0845   CREATININE 1.0 03/12/2015 1439  CREATININE 0.90 03/14/2014 0845      Component Value Date/Time   CALCIUM 9.5 03/12/2015 1439   CALCIUM 9.5 03/14/2014 0845   ALKPHOS 83 03/12/2015 1439   ALKPHOS 89 03/14/2014 0845   AST 26 03/12/2015 1439   AST 16 03/14/2014 0845   ALT 32 03/12/2015 1439   ALT 24 03/14/2014 0845   BILITOT 0.70 03/12/2015 1439   BILITOT 0.4 03/14/2014 0845         Impression and Plan: [name] is 69 year old gentleman. He has a history of testicular lymphoma. He was treated with systemic chemotherapy with 6 cycles of R.-CHOP. he completed this in May 2013.  He then had radiation therapy to the scrotal region. We then  tried him on intrathecal chemotherapy because of the risk of relapse  into the CNS with testicular lymphoma. He did very poorly with this. I think he may have gotten one or 2 treatments.  Has been over  3 years since he completed his chemotherapy and radiation therapy. I think that his risk of relapse at this point is probably less than 15%.  I will get him back in 6 months unless something happens sooner. I told him to make sure he takes vitamin D. He does take baby aspirin.   Volanda Napoleon, MD 10/19/201611:56 AM

## 2015-09-06 LAB — VITAMIN D 25 HYDROXY (VIT D DEFICIENCY, FRACTURES): Vit D, 25-Hydroxy: 35 ng/mL (ref 30–100)

## 2015-09-10 ENCOUNTER — Other Ambulatory Visit: Payer: Managed Care, Other (non HMO)

## 2015-09-12 ENCOUNTER — Ambulatory Visit: Payer: Managed Care, Other (non HMO) | Admitting: Hematology & Oncology

## 2016-03-05 ENCOUNTER — Encounter: Payer: Self-pay | Admitting: Hematology & Oncology

## 2016-03-05 ENCOUNTER — Other Ambulatory Visit (HOSPITAL_BASED_OUTPATIENT_CLINIC_OR_DEPARTMENT_OTHER): Payer: Managed Care, Other (non HMO)

## 2016-03-05 ENCOUNTER — Ambulatory Visit (HOSPITAL_BASED_OUTPATIENT_CLINIC_OR_DEPARTMENT_OTHER): Payer: Managed Care, Other (non HMO) | Admitting: Hematology & Oncology

## 2016-03-05 VITALS — BP 119/75 | HR 67 | Temp 97.4°F | Resp 14 | Ht 69.0 in | Wt 179.0 lb

## 2016-03-05 DIAGNOSIS — Z8572 Personal history of non-Hodgkin lymphomas: Secondary | ICD-10-CM

## 2016-03-05 DIAGNOSIS — C8332 Diffuse large B-cell lymphoma, intrathoracic lymph nodes: Secondary | ICD-10-CM

## 2016-03-05 DIAGNOSIS — C8335 Diffuse large B-cell lymphoma, lymph nodes of inguinal region and lower limb: Secondary | ICD-10-CM

## 2016-03-05 LAB — COMPREHENSIVE METABOLIC PANEL
ALT: 32 U/L (ref 0–55)
ANION GAP: 7 meq/L (ref 3–11)
AST: 21 U/L (ref 5–34)
Albumin: 4 g/dL (ref 3.5–5.0)
Alkaline Phosphatase: 73 U/L (ref 40–150)
BILIRUBIN TOTAL: 0.43 mg/dL (ref 0.20–1.20)
BUN: 16 mg/dL (ref 7.0–26.0)
CALCIUM: 9.5 mg/dL (ref 8.4–10.4)
CO2: 26 meq/L (ref 22–29)
CREATININE: 1 mg/dL (ref 0.7–1.3)
Chloride: 107 mEq/L (ref 98–109)
EGFR: 78 mL/min/{1.73_m2} — ABNORMAL LOW (ref >90)
Glucose: 92 mg/dl (ref 70–140)
Potassium: 4.2 mEq/L (ref 3.5–5.1)
Sodium: 140 mEq/L (ref 136–145)
TOTAL PROTEIN: 7.2 g/dL (ref 6.4–8.3)

## 2016-03-05 LAB — CBC WITH DIFFERENTIAL (CANCER CENTER ONLY)
BASO#: 0 10*3/uL (ref 0.0–0.2)
BASO%: 0.8 % (ref 0.0–2.0)
EOS ABS: 0.3 10*3/uL (ref 0.0–0.5)
EOS%: 5.4 % (ref 0.0–7.0)
HCT: 42.9 % (ref 38.7–49.9)
HEMOGLOBIN: 14.9 g/dL (ref 13.0–17.1)
LYMPH#: 2 10*3/uL (ref 0.9–3.3)
LYMPH%: 41.9 % (ref 14.0–48.0)
MCH: 31.6 pg (ref 28.0–33.4)
MCHC: 34.7 g/dL (ref 32.0–35.9)
MCV: 91 fL (ref 82–98)
MONO#: 0.7 10*3/uL (ref 0.1–0.9)
MONO%: 13.4 % — ABNORMAL HIGH (ref 0.0–13.0)
NEUT%: 38.5 % — ABNORMAL LOW (ref 40.0–80.0)
NEUTROS ABS: 1.9 10*3/uL (ref 1.5–6.5)
Platelets: 161 10*3/uL (ref 145–400)
RBC: 4.71 10*6/uL (ref 4.20–5.70)
RDW: 12.5 % (ref 11.1–15.7)
WBC: 4.9 10*3/uL (ref 4.0–10.0)

## 2016-03-05 LAB — LACTATE DEHYDROGENASE: LDH: 156 U/L (ref 125–245)

## 2016-03-05 NOTE — Progress Notes (Signed)
Hematology and Oncology Follow Up Visit  DELVONTE SOMOZA [id number] 1946-01-07 70 y.o. 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  Wilden is 70 year old who is back for followup. He's doing okay. I last saw him back in October He has been doing pretty well. He had a good winter. He has had some issues with his joints.   He has noted some dysuria area and this is being offered couple weeks. We will go ahead and check a urinalysis on him. I know that he got radiation therapy down to the genital region as part of his treatment protocol. As such, he may have some cystitis. He may need to have a cystoscopy. We will definitely check a urinalysis on him. He's not had any hematuria.  His vitamin D level back in October was 35. He now is on 2000 units a day.  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  He has noticed some tremors in the right inguinal region. He has not noted any palpable lymph glands.   Overall, his performance status is ECOG 0.  Medications:  Current outpatient prescriptions:  .  aspirin 81 MG tablet, Take 81 mg by mouth daily. Pt states he takes occ., Disp: , Rfl:  .  cholecalciferol (VITAMIN D) 1000 UNITS tablet, Take 1,000 Units by mouth daily., Disp: , Rfl:  .  [DISCONTINUED] prochlorperazine (COMPAZINE) 10 MG tablet, Take 1 tablet (10 mg total) by mouth every 6 (six) hours as needed (Nausea or vomiting)., Disp: 30 tablet, Rfl: 6  Allergies:  Allergies  Allergen Reactions  . Morphine And Related Itching  . Motrin [Ibuprofen] Nausea Only    Past Medical History, Surgical history, Social history, and Family History were reviewed and updated.  Review of Systems: As above  Physical Exam:  height is 5\' 9"  (1.753 m) and weight is 179 lb (81.194 kg). His oral temperature is  97.4 F (36.3 C). His blood pressure is 119/75 and his pulse is 67. His respiration is 14.   Well-developed and well-nourished white gentleman. His pulse is 78. His respiratory rate is 14. His blood pressure is 118/76. His weight was not taken. Head and neck exam shows no nystagmus. Pupils reacted properly. No oral lesion. Oral mucosa is moist. Neck is supple with no adenopathy. Thyroid is nonpalpable. Lungs are clear. Cardiac exam regular in rhythm with no murmurs rubs or bruits. Abdomen soft. Has good bowel sounds. There is no fluid wave. There is no palpable abdominal mass. He has no palpable liver or spleen tip. He has well-healed left inguinal orchiectomy scar. Extremities no clubbing cyanosis or edema. Has good strength in his muscles. He has no joint swelling. Neurological exam shows no focal neurological deficits. Skin exam shows no rashes, ecchymoses or petechia.  Lab Results  Component Value Date   WBC 4.9 03/05/2016   HGB 14.9 03/05/2016   HCT 42.9 03/05/2016   MCV 91 03/05/2016   PLT 161 03/05/2016     Chemistry      Component Value Date/Time   NA 139 09/05/2015 1045   NA 141 03/12/2015 1439   NA 138 03/14/2014 0845   K 4.1 09/05/2015 1045   K 4.0 03/12/2015 1439   K 3.9 03/14/2014 0845   CL 102 03/12/2015 1439  CL 103 03/14/2014 0845   CO2 24 09/05/2015 1045   CO2 29 03/12/2015 1439   CO2 26 03/14/2014 0845   BUN 13.6 09/05/2015 1045   BUN 14 03/12/2015 1439   BUN 13 03/14/2014 0845   CREATININE 0.9 09/05/2015 1045   CREATININE 1.0 03/12/2015 1439   CREATININE 0.90 03/14/2014 0845      Component Value Date/Time   CALCIUM 9.6 09/05/2015 1045   CALCIUM 9.5 03/12/2015 1439   CALCIUM 9.5 03/14/2014 0845   ALKPHOS 84 09/05/2015 1045   ALKPHOS 83 03/12/2015 1439   ALKPHOS 89 03/14/2014 0845   AST 20 09/05/2015 1045   AST 26 03/12/2015 1439   AST 16 03/14/2014 0845   ALT 31 09/05/2015 1045   ALT 32 03/12/2015 1439   ALT 24 03/14/2014 0845   BILITOT 0.46 09/05/2015  1045   BILITOT 0.70 03/12/2015 1439   BILITOT 0.4 03/14/2014 0845         Impression and Plan: Mr. Ensor is 70 year old gentleman. He has a history of testicular lymphoma. He was treated with systemic chemotherapy with 6 cycles of R.-CHOP. he completed this in May 2013.  He then had radiation therapy to the scrotal region. We then  tried him on intrathecal chemotherapy because of the risk of relapse into the CNS with testicular lymphoma. He did very poorly with this. I think he may have gotten one or 2 treatments.  Has been 4 years since he completed his chemotherapy and radiation therapy. I think that his risk of relapse at this point is probably less than 10%.  I will get him back in 6 months unless something happens sooner. I told him to make sure he takes vitamin D. He does take baby aspirin.   Volanda Napoleon, MD 4/19/201711:56 AM

## 2016-03-06 LAB — VITAMIN D 25 HYDROXY (VIT D DEFICIENCY, FRACTURES): VIT D 25 HYDROXY: 34.4 ng/mL (ref 30.0–100.0)

## 2016-09-04 ENCOUNTER — Other Ambulatory Visit (HOSPITAL_BASED_OUTPATIENT_CLINIC_OR_DEPARTMENT_OTHER): Payer: Managed Care, Other (non HMO)

## 2016-09-04 DIAGNOSIS — C8335 Diffuse large B-cell lymphoma, lymph nodes of inguinal region and lower limb: Secondary | ICD-10-CM

## 2016-09-04 LAB — CBC WITH DIFFERENTIAL (CANCER CENTER ONLY)
BASO#: 0.1 10*3/uL (ref 0.0–0.2)
BASO%: 1.3 % (ref 0.0–2.0)
EOS%: 3.9 % (ref 0.0–7.0)
Eosinophils Absolute: 0.2 10*3/uL (ref 0.0–0.5)
HCT: 43.2 % (ref 38.7–49.9)
HEMOGLOBIN: 15.3 g/dL (ref 13.0–17.1)
LYMPH#: 1.9 10*3/uL (ref 0.9–3.3)
LYMPH%: 36.1 % (ref 14.0–48.0)
MCH: 32.1 pg (ref 28.0–33.4)
MCHC: 35.4 g/dL (ref 32.0–35.9)
MCV: 91 fL (ref 82–98)
MONO#: 0.6 10*3/uL (ref 0.1–0.9)
MONO%: 11.5 % (ref 0.0–13.0)
NEUT%: 47.2 % (ref 40.0–80.0)
NEUTROS ABS: 2.5 10*3/uL (ref 1.5–6.5)
PLATELETS: 175 10*3/uL (ref 145–400)
RBC: 4.76 10*6/uL (ref 4.20–5.70)
RDW: 12.4 % (ref 11.1–15.7)
WBC: 5.4 10*3/uL (ref 4.0–10.0)

## 2016-09-04 LAB — COMPREHENSIVE METABOLIC PANEL
ALT: 30 U/L (ref 0–55)
AST: 21 U/L (ref 5–34)
Albumin: 3.9 g/dL (ref 3.5–5.0)
Alkaline Phosphatase: 103 U/L (ref 40–150)
Anion Gap: 10 mEq/L (ref 3–11)
BUN: 11.7 mg/dL (ref 7.0–26.0)
CALCIUM: 9.6 mg/dL (ref 8.4–10.4)
CHLORIDE: 105 meq/L (ref 98–109)
CO2: 25 meq/L (ref 22–29)
CREATININE: 0.9 mg/dL (ref 0.7–1.3)
EGFR: 81 mL/min/{1.73_m2} — ABNORMAL LOW (ref >90)
Glucose: 89 mg/dl (ref 70–140)
Potassium: 4.5 mEq/L (ref 3.5–5.1)
Sodium: 139 mEq/L (ref 136–145)
Total Bilirubin: 0.33 mg/dL (ref 0.20–1.20)
Total Protein: 7.5 g/dL (ref 6.4–8.3)

## 2016-09-04 LAB — LACTATE DEHYDROGENASE: LDH: 163 U/L (ref 125–245)

## 2016-09-05 ENCOUNTER — Ambulatory Visit (HOSPITAL_BASED_OUTPATIENT_CLINIC_OR_DEPARTMENT_OTHER): Payer: Managed Care, Other (non HMO) | Admitting: Hematology & Oncology

## 2016-09-05 ENCOUNTER — Encounter: Payer: Self-pay | Admitting: Hematology & Oncology

## 2016-09-05 VITALS — BP 130/69 | HR 69 | Temp 97.4°F | Resp 16 | Ht 69.0 in | Wt 177.0 lb

## 2016-09-05 DIAGNOSIS — Z7982 Long term (current) use of aspirin: Secondary | ICD-10-CM | POA: Diagnosis not present

## 2016-09-05 DIAGNOSIS — C8339 Diffuse large B-cell lymphoma, extranodal and solid organ sites: Secondary | ICD-10-CM

## 2016-09-05 DIAGNOSIS — C8330 Diffuse large B-cell lymphoma, unspecified site: Secondary | ICD-10-CM

## 2016-09-05 NOTE — Progress Notes (Signed)
Hematology and Oncology Follow Up Visit  [name] [id number] 1946/07/01 70 y.o. 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 He's doing okay. I last saw him back in April. He has been doing pretty well. He had a good summer. He and his family went to the beach. There was going July.   He has had some issues with his joints. His right elbow has caused problems for him. He has seen orthopedic surgery. He has low bursitis. He has had this drained.  He has noted some dysuria area and this is being offered couple weeks. We will go ahead and check a urinalysis on him. I know that he got radiation therapy down to the genital region as part of his treatment protocol. As such, he may have some cystitis. He may need to have a cystoscopy. We will definitely check a urinalysis on him. He's not had any hematuria.  His vitamin D level back in April was 34.4.. He now is on 2000 units a day.  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 I see him on occasion over it, and hospital.   Overall, his performance status is ECOG 0.  Medications:  Current Outpatient Prescriptions:  .  aspirin 81 MG tablet, Take 81 mg by mouth daily. Pt states he takes occ., Disp: , Rfl:  .  cholecalciferol (VITAMIN D) 1000 UNITS tablet, Take 1,000 Units by mouth daily., Disp: , Rfl:   Allergies:  Allergies  Allergen Reactions  . Morphine And Related Itching  . Motrin [Ibuprofen] Nausea Only    Past Medical History, Surgical history, Social history, and Family History were reviewed and updated.  Review of Systems: As above  Physical Exam:  height is 5\' 9"  (1.753 m) and weight is 177 lb (80.3 kg). His oral temperature is 97.4 F (36.3 C). His blood pressure is 130/69 and  his pulse is 69. His respiration is 16.   Well-developed and well-nourished white gentleman. His pulse is 78. His respiratory rate is 14. His blood pressure is 118/76. His weight was not taken. Head and neck exam shows no nystagmus. Pupils reacted properly. No oral lesion. Oral mucosa is moist. Neck is supple with no adenopathy. Thyroid is nonpalpable. Lungs are clear. Cardiac exam regular in rhythm with no murmurs rubs or bruits. Abdomen soft. Has good bowel sounds. There is no fluid wave. There is no palpable abdominal mass. He has no palpable liver or spleen tip. He has well-healed left inguinal orchiectomy scar. Extremities no clubbing cyanosis or edema. Has good strength in his muscles. He has no joint swelling. Neurological exam shows no focal neurological deficits. Skin exam shows no rashes, ecchymoses or petechia.  Lab Results  Component Value Date   WBC 5.4 09/04/2016   HGB 15.3 09/04/2016   HCT 43.2 09/04/2016   MCV 91 09/04/2016   PLT 175 09/04/2016     Chemistry      Component Value Date/Time   NA 139 09/04/2016 1146   K 4.5 09/04/2016 1146   CL 102 03/12/2015 1439   CO2 25 09/04/2016 1146   BUN 11.7 09/04/2016 1146   CREATININE 0.9 09/04/2016 1146      Component Value Date/Time   CALCIUM  9.6 09/04/2016 1146   ALKPHOS 103 09/04/2016 1146   AST 21 09/04/2016 1146   ALT 30 09/04/2016 1146   BILITOT 0.33 09/04/2016 1146         Impression and Plan: [name] is 70 year old gentleman. He has a history of testicular lymphoma. He was treated with systemic chemotherapy with 6 cycles of R.-CHOP. he completed this in May 2013.  He then had radiation therapy to the scrotal region. We then  tried him on intrathecal chemotherapy because of the risk of relapse into the CNS with testicular lymphoma. He did very poorly with this. I think he may have gotten one or 2 treatments.  Has been 41/2 years since he completed his chemotherapy and radiation therapy. I think that his risk of  relapse at this point is probably less than 10%.  I will get him back in 6 months unless something happens sooner. I told him to make sure he takes vitamin D. He does take baby aspirin.   Once we get him through 2018, then we can let him go for the clinic.  Volanda Napoleon, MD 10/20/201711:42 AM

## 2017-03-06 ENCOUNTER — Other Ambulatory Visit (HOSPITAL_BASED_OUTPATIENT_CLINIC_OR_DEPARTMENT_OTHER): Payer: 59

## 2017-03-06 ENCOUNTER — Ambulatory Visit (HOSPITAL_BASED_OUTPATIENT_CLINIC_OR_DEPARTMENT_OTHER): Payer: 59 | Admitting: Family

## 2017-03-06 VITALS — BP 140/75 | HR 71 | Temp 97.9°F | Resp 16 | Wt 179.8 lb

## 2017-03-06 DIAGNOSIS — C8339 Diffuse large B-cell lymphoma, extranodal and solid organ sites: Secondary | ICD-10-CM

## 2017-03-06 DIAGNOSIS — C8330 Diffuse large B-cell lymphoma, unspecified site: Secondary | ICD-10-CM

## 2017-03-06 DIAGNOSIS — E559 Vitamin D deficiency, unspecified: Secondary | ICD-10-CM

## 2017-03-06 LAB — CBC WITH DIFFERENTIAL (CANCER CENTER ONLY)
BASO#: 0.1 10*3/uL (ref 0.0–0.2)
BASO%: 1.3 % (ref 0.0–2.0)
EOS%: 3.8 % (ref 0.0–7.0)
Eosinophils Absolute: 0.2 10*3/uL (ref 0.0–0.5)
HEMATOCRIT: 41.2 % (ref 38.7–49.9)
HEMOGLOBIN: 14.4 g/dL (ref 13.0–17.1)
LYMPH#: 2 10*3/uL (ref 0.9–3.3)
LYMPH%: 42.1 % (ref 14.0–48.0)
MCH: 31.6 pg (ref 28.0–33.4)
MCHC: 35 g/dL (ref 32.0–35.9)
MCV: 91 fL (ref 82–98)
MONO#: 0.6 10*3/uL (ref 0.1–0.9)
MONO%: 12.8 % (ref 0.0–13.0)
NEUT#: 1.9 10*3/uL (ref 1.5–6.5)
NEUT%: 40 % (ref 40.0–80.0)
Platelets: 164 10*3/uL (ref 145–400)
RBC: 4.55 10*6/uL (ref 4.20–5.70)
RDW: 12.5 % (ref 11.1–15.7)
WBC: 4.8 10*3/uL (ref 4.0–10.0)

## 2017-03-06 LAB — LACTATE DEHYDROGENASE: LDH: 169 U/L (ref 125–245)

## 2017-03-06 LAB — COMPREHENSIVE METABOLIC PANEL
ALBUMIN: 4 g/dL (ref 3.5–5.0)
ALT: 36 U/L (ref 0–55)
AST: 24 U/L (ref 5–34)
Alkaline Phosphatase: 79 U/L (ref 40–150)
Anion Gap: 9 mEq/L (ref 3–11)
BILIRUBIN TOTAL: 0.47 mg/dL (ref 0.20–1.20)
BUN: 10.9 mg/dL (ref 7.0–26.0)
CALCIUM: 9.8 mg/dL (ref 8.4–10.4)
CO2: 27 mEq/L (ref 22–29)
Chloride: 106 mEq/L (ref 98–109)
Creatinine: 0.9 mg/dL (ref 0.7–1.3)
EGFR: 87 mL/min/{1.73_m2} — AB (ref >90)
GLUCOSE: 78 mg/dL (ref 70–140)
POTASSIUM: 4 meq/L (ref 3.5–5.1)
SODIUM: 142 meq/L (ref 136–145)
TOTAL PROTEIN: 7 g/dL (ref 6.4–8.3)

## 2017-03-06 NOTE — Progress Notes (Signed)
Hematology and Oncology Follow Up Visit  [name] [id number] [birth year] 71 y.o. 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 He is doing well and has no complaints at this time. He has had no problems urinating. No blood visible in urine or stool. No anemia.  So scrotal swelling, pain or redness.  He verbalized that he is still taking his vitamin D and 1 baby aspirin daily.  No fever, chills, n/v, cough, rash, dizziness, SOB, chest pain, palpitations, abdominal pain or changes in bowel or bladder habits.  No swelling, tenderness, numbness or tingling in his extremities. No c/o joint aches or bone pain.  He has maintained a good appetite and is staying well hydrated. His weight is stable.  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 He is staying active and has no complaint of fatigue.   ECOG Performance Status: 0 - Asymptomatic  Medications:  Allergies as of 03/06/2017      Reactions   Morphine And Related Itching   Motrin [ibuprofen] Nausea Only      Medication List       Accurate as of 03/06/17 10:20 AM. Always use your most recent med list.          aspirin 81 MG tablet Take 81 mg by mouth daily. Pt states he takes occ.   cholecalciferol 1000 units tablet Commonly known as:  VITAMIN D Take 1,000 Units by mouth daily.       Allergies:  Allergies  Allergen Reactions  . Morphine And Related Itching  . Motrin [Ibuprofen] Nausea Only    Past Medical History, Surgical history, Social history, and Family History were reviewed and updated.  Review of Systems: All other 10 point review of systems is negative.   Physical Exam:  vitals were not taken for this visit.  Wt Readings from Last 3 Encounters:  09/05/16 177 lb (80.3 kg)  03/05/16 179 lb (81.2 kg)  09/05/15 177 lb (80.3 kg)      Ocular: Sclerae unicteric, pupils equal, round and reactive to light Ear-nose-throat: Oropharynx clear, dentition fair Lymphatic: No cervical, supraclavicular or axillary adenopathy Lungs no rales or rhonchi, good excursion bilaterally Heart regular rate and rhythm, no murmur appreciated Abd soft, nontender, positive bowel sounds, no liver or spleen tip palpated on exam, no fluid wave  MSK no focal spinal tenderness, no joint edema Neuro: non-focal, well-oriented, appropriate affect Breasts: Deferred   Lab Results  Component Value Date   WBC 4.8 03/06/2017   HGB 14.4 03/06/2017   HCT 41.2 03/06/2017   MCV 91 03/06/2017   PLT 164 03/06/2017   No results found for: FERRITIN, IRON, TIBC, UIBC, IRONPCTSAT Lab Results  Component Value Date   RBC 4.55 03/06/2017   No results found for: KPAFRELGTCHN, LAMBDASER, KAPLAMBRATIO No results found for: IGGSERUM, IGA, IGMSERUM No results found for: Ronnald Ramp, A1GS, A2GS, Violet Baldy, MSPIKE, SPEI   Chemistry      Component Value Date/Time   NA 139 09/04/2016 1146   K 4.5 09/04/2016 1146   CL 102 03/12/2015 1439   CO2 25 09/04/2016 1146   BUN 11.7 09/04/2016 1146   CREATININE 0.9 09/04/2016 1146      Component Value Date/Time   CALCIUM 9.6 09/04/2016 1146   ALKPHOS 103 09/04/2016 1146   AST 21 09/04/2016  1146   ALT 30 09/04/2016 1146   BILITOT 0.33 09/04/2016 1146      Impression and Plan: [name] is 71 yo caucasian gentleman with history of testicular lymphoma treated with 6 cycles of R-CHOP completed this in May 2013. He then had radiation therapy to the scrotal region. We then tried him on intrathecal chemotherapy because of the risk of relapse into the CNS with testicular lymphoma. He did not do well with this and was only abe to tolerate two treatments before stopping.  He is doing well and is asymptomatic at this time. I spent 15 minutes face to face with him counseling.  We will continue to  follow along with his and plan to see him back n 6 months. Dr. Marin Olp feels that if he does well through 2018 we can let him go at the end of the year and follow-up only as needed.  He will contact our office with any questions or concerns. We can certainly see him sooner if nee be.   Eliezer Bottom, NP 4/20/201810:20 AM

## 2017-09-02 ENCOUNTER — Other Ambulatory Visit (HOSPITAL_BASED_OUTPATIENT_CLINIC_OR_DEPARTMENT_OTHER): Payer: 59

## 2017-09-02 ENCOUNTER — Ambulatory Visit (HOSPITAL_BASED_OUTPATIENT_CLINIC_OR_DEPARTMENT_OTHER): Payer: 59 | Admitting: Hematology & Oncology

## 2017-09-02 VITALS — BP 134/71 | HR 75 | Temp 97.8°F | Resp 18 | Wt 175.0 lb

## 2017-09-02 DIAGNOSIS — Z8572 Personal history of non-Hodgkin lymphomas: Secondary | ICD-10-CM

## 2017-09-02 DIAGNOSIS — E559 Vitamin D deficiency, unspecified: Secondary | ICD-10-CM

## 2017-09-02 DIAGNOSIS — Z923 Personal history of irradiation: Secondary | ICD-10-CM | POA: Diagnosis not present

## 2017-09-02 DIAGNOSIS — C8335 Diffuse large B-cell lymphoma, lymph nodes of inguinal region and lower limb: Secondary | ICD-10-CM

## 2017-09-02 DIAGNOSIS — Z9221 Personal history of antineoplastic chemotherapy: Secondary | ICD-10-CM | POA: Diagnosis not present

## 2017-09-02 DIAGNOSIS — C8330 Diffuse large B-cell lymphoma, unspecified site: Secondary | ICD-10-CM

## 2017-09-02 LAB — CBC WITH DIFFERENTIAL (CANCER CENTER ONLY)
BASO#: 0.1 10*3/uL (ref 0.0–0.2)
BASO%: 1 % (ref 0.0–2.0)
EOS%: 5.4 % (ref 0.0–7.0)
Eosinophils Absolute: 0.3 10*3/uL (ref 0.0–0.5)
HEMATOCRIT: 42.4 % (ref 38.7–49.9)
HGB: 14.9 g/dL (ref 13.0–17.1)
LYMPH#: 2.2 10*3/uL (ref 0.9–3.3)
LYMPH%: 46 % (ref 14.0–48.0)
MCH: 32 pg (ref 28.0–33.4)
MCHC: 35.1 g/dL (ref 32.0–35.9)
MCV: 91 fL (ref 82–98)
MONO#: 0.6 10*3/uL (ref 0.1–0.9)
MONO%: 12.4 % (ref 0.0–13.0)
NEUT#: 1.7 10*3/uL (ref 1.5–6.5)
NEUT%: 35.2 % — AB (ref 40.0–80.0)
Platelets: 178 10*3/uL (ref 145–400)
RBC: 4.66 10*6/uL (ref 4.20–5.70)
RDW: 12.4 % (ref 11.1–15.7)
WBC: 4.8 10*3/uL (ref 4.0–10.0)

## 2017-09-02 LAB — COMPREHENSIVE METABOLIC PANEL
ALT: 28 U/L (ref 0–55)
AST: 21 U/L (ref 5–34)
Albumin: 4 g/dL (ref 3.5–5.0)
Alkaline Phosphatase: 81 U/L (ref 40–150)
Anion Gap: 8 mEq/L (ref 3–11)
BUN: 15.9 mg/dL (ref 7.0–26.0)
CALCIUM: 9.5 mg/dL (ref 8.4–10.4)
CHLORIDE: 105 meq/L (ref 98–109)
CO2: 26 meq/L (ref 22–29)
CREATININE: 1.1 mg/dL (ref 0.7–1.3)
EGFR: >60 mL/min/{1.73_m2} (ref >60)
Glucose: 90 mg/dl (ref 70–140)
Potassium: 4.3 mEq/L (ref 3.5–5.1)
Sodium: 140 mEq/L (ref 136–145)
TOTAL PROTEIN: 7.2 g/dL (ref 6.4–8.3)
Total Bilirubin: 0.39 mg/dL (ref 0.20–1.20)

## 2017-09-02 LAB — LACTATE DEHYDROGENASE: LDH: 180 U/L (ref 125–245)

## 2017-09-02 NOTE — Progress Notes (Signed)
Hematology and Oncology Follow Up Visit  [name] [id number] [birth year] 71 y.o. 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  [name] is back for follow-up. I see him all the time. 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  He is doing well. He has had no problems since we last saw him. He has had a good summer.  He has had no problems with cough or shortness of breath. He's had no nausea or vomiting. He's had no change in bowel or bladder habits. He's had no rashes. He's had no leading. He's had no fever. He's had no infections.  He talked me a little bit about his sister who is 48 years old. She lives in [place]. She has what sounds like myelodysplasia. I told him that she can was come down here and we could take care of her.  There is no problems with leg swelling. He's had no weight loss or weight gain. Rafi's take his vitamin D and aspirin. I told him to make sure that he takes 2000 units daily of vitamin D.  Overall, his performance status is ECOG 0.  Medications:  Current Outpatient Prescriptions:  .  aspirin 81 MG tablet, Take 81 mg by mouth daily. Pt states he takes occ., Disp: , Rfl:  .  cholecalciferol (VITAMIN D) 1000 UNITS tablet, Take 1,000 Units by mouth daily., Disp: , Rfl:   Allergies:  Allergies  Allergen Reactions  . Morphine And Related Itching  . Motrin [Ibuprofen] Nausea Only    Past Medical History, Surgical history, Social history, and Family History were reviewed and updated.  Review of Systems: As stated in the interim history  Physical Exam:  weight is 175 lb (79.4 kg). His oral temperature is 97.8 F (36.6 C). His blood pressure is 134/71 and his pulse is 75. His respiration is 18 and oxygen saturation is 97%.   Physical Exam  Constitutional: He is oriented to person, place, and  time.  HENT:  Head: Normocephalic and atraumatic.  Mouth/Throat: Oropharynx is clear and moist.  Eyes: Pupils are equal, round, and reactive to light. EOM are normal.  Neck: Normal range of motion.  Cardiovascular: Normal rate, regular rhythm and normal heart sounds.   Pulmonary/Chest: Effort normal and breath sounds normal.  Abdominal: Soft. Bowel sounds are normal.  Musculoskeletal: Normal range of motion. He exhibits no edema, tenderness or deformity.  Lymphadenopathy:    He has no cervical adenopathy.  Neurological: He is alert and oriented to person, place, and time.  Skin: Skin is warm and dry. No rash noted. No erythema.  Psychiatric: He has a normal mood and affect. His behavior is normal. Judgment and thought content normal.  Vitals reviewed.   Lab Results  Component Value Date   WBC 4.8 09/02/2017   HGB 14.9 09/02/2017   HCT 42.4 09/02/2017   MCV 91 09/02/2017   PLT 178 09/02/2017     Chemistry      Component Value Date/Time   NA 142 03/06/2017 1005   K 4.0 03/06/2017 1005   CL 102 03/12/2015 1439   CO2 27 03/06/2017 1005   BUN 10.9 03/06/2017 1005   CREATININE 0.9 03/06/2017 1005      Component Value Date/Time   CALCIUM 9.8 03/06/2017 1005   ALKPHOS 79 03/06/2017 1005  AST 24 03/06/2017 1005   ALT 36 03/06/2017 1005   BILITOT 0.47 03/06/2017 1005         Impression and Plan: Mr. [name] is 71 year old gentleman. He has a history of testicular lymphoma. He was treated with systemic chemotherapy with 6 cycles of R.-CHOP. he completed this in May 2013.  He then had radiation therapy to the scrotal region. We then  tried him on intrathecal chemotherapy because of the risk of relapse into the CNS with testicular lymphoma. He did very poorly with this. I think he may have gotten one or 2 treatments.  Everything looks great. I do not see any evidence of recurrent lymphoma.  I think we can get him back in 6 months. I think it everything looks okay in 6 months,  then we can let him go from the clinic as his risk of recurrence would be less than 5%.   Volanda Napoleon, MD 10/17/201810:54 AM

## 2017-09-03 LAB — VITAMIN D 25 HYDROXY (VIT D DEFICIENCY, FRACTURES): Vitamin D, 25-Hydroxy: 38.7 ng/mL (ref 30.0–100.0)

## 2018-03-03 ENCOUNTER — Inpatient Hospital Stay: Payer: 59

## 2018-03-03 ENCOUNTER — Inpatient Hospital Stay: Payer: 59 | Attending: Hematology & Oncology | Admitting: Hematology & Oncology

## 2018-03-03 ENCOUNTER — Encounter: Payer: Self-pay | Admitting: Hematology & Oncology

## 2018-03-03 ENCOUNTER — Other Ambulatory Visit: Payer: Self-pay

## 2018-03-03 VITALS — BP 134/73 | HR 71 | Temp 97.5°F | Resp 20 | Wt 176.0 lb

## 2018-03-03 DIAGNOSIS — C8335 Diffuse large B-cell lymphoma, lymph nodes of inguinal region and lower limb: Secondary | ICD-10-CM

## 2018-03-03 DIAGNOSIS — E559 Vitamin D deficiency, unspecified: Secondary | ICD-10-CM

## 2018-03-03 DIAGNOSIS — Z8572 Personal history of non-Hodgkin lymphomas: Secondary | ICD-10-CM

## 2018-03-03 DIAGNOSIS — C8589 Other specified types of non-Hodgkin lymphoma, extranodal and solid organ sites: Secondary | ICD-10-CM

## 2018-03-03 LAB — CMP (CANCER CENTER ONLY)
ALK PHOS: 78 U/L (ref 26–84)
ALT: 52 U/L — AB (ref 10–47)
ANION GAP: 8 (ref 5–15)
AST: 29 U/L (ref 11–38)
Albumin: 4 g/dL (ref 3.5–5.0)
BUN: 12 mg/dL (ref 7–22)
CHLORIDE: 103 mmol/L (ref 98–108)
CO2: 31 mmol/L (ref 18–33)
Calcium: 10.3 mg/dL (ref 8.0–10.3)
Creatinine: 1 mg/dL (ref 0.60–1.20)
Glucose, Bld: 94 mg/dL (ref 73–118)
POTASSIUM: 4.1 mmol/L (ref 3.3–4.7)
Sodium: 142 mmol/L (ref 128–145)
TOTAL PROTEIN: 7.2 g/dL (ref 6.4–8.1)
Total Bilirubin: 0.7 mg/dL (ref 0.2–1.6)

## 2018-03-03 LAB — CBC WITH DIFFERENTIAL (CANCER CENTER ONLY)
BASOS ABS: 0.1 10*3/uL (ref 0.0–0.1)
Basophils Relative: 1 %
Eosinophils Absolute: 0.3 10*3/uL (ref 0.0–0.5)
Eosinophils Relative: 5 %
HEMATOCRIT: 43 % (ref 38.7–49.9)
HEMOGLOBIN: 15 g/dL (ref 13.0–17.1)
LYMPHS ABS: 2.7 10*3/uL (ref 0.9–3.3)
LYMPHS PCT: 47 %
MCH: 31.4 pg (ref 28.0–33.4)
MCHC: 34.9 g/dL (ref 32.0–35.9)
MCV: 90.1 fL (ref 82.0–98.0)
Monocytes Absolute: 0.7 10*3/uL (ref 0.1–0.9)
Monocytes Relative: 13 %
NEUTROS ABS: 1.9 10*3/uL (ref 1.5–6.5)
Neutrophils Relative %: 34 %
Platelet Count: 178 10*3/uL (ref 145–400)
RBC: 4.77 MIL/uL (ref 4.20–5.70)
RDW: 12.4 % (ref 11.1–15.7)
WBC: 5.6 10*3/uL (ref 4.0–10.0)

## 2018-03-03 LAB — LACTATE DEHYDROGENASE: LDH: 165 U/L (ref 125–245)

## 2018-03-03 NOTE — Progress Notes (Signed)
Hematology and Oncology Follow Up Visit  [name] [id number] 1946/10/21 72 y.o. 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 I see him all the time. 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  As always, he is very busy.  It is now been 6 years since he completed his treatments.  He has had no evidence of residual/recurrent disease.  He has had no fever.  He has had no swollen lymph nodes.  He has had no rashes.  He has had no leg swelling.  He has had no change in bowel or bladder habits.  He has had no cough or shortness of breath.  There is been no headaches.  He has had no problems with bony pain.    He says that he is taking his vitamin D.  Overall, his performance status is ECOG 0.  Medications:  Current Outpatient Medications:  .  aspirin 81 MG tablet, Take 81 mg by mouth daily. Pt states he takes occ., Disp: , Rfl:  .  cholecalciferol (VITAMIN D) 1000 UNITS tablet, Take 1,000 Units by mouth daily., Disp: , Rfl:   Allergies:  Allergies  Allergen Reactions  . Morphine And Related Itching  . Motrin [Ibuprofen] Nausea Only    Past Medical History, Surgical history, Social history, and Family History were reviewed and updated.  Review of Systems: Review of Systems  Constitutional: Negative.   HENT: Negative.   Eyes: Negative.   Respiratory: Negative.   Cardiovascular: Negative.   Gastrointestinal: Negative.   Genitourinary: Negative.   Musculoskeletal: Negative.   Skin: Negative.   Neurological: Negative.   Endo/Heme/Allergies: Negative.   Psychiatric/Behavioral: Negative.      Physical Exam:  weight is 176 lb (79.8 kg). His oral temperature is 97.5 F (36.4 C) (abnormal). His blood pressure is 134/73 and his pulse is 71. His respiration is 20 and oxygen saturation is 100%.   Physical Exam   Constitutional: He is oriented to person, place, and time.  HENT:  Head: Normocephalic and atraumatic.  Mouth/Throat: Oropharynx is clear and moist.  Eyes: Pupils are equal, round, and reactive to light. EOM are normal.  Neck: Normal range of motion.  Cardiovascular: Normal rate, regular rhythm and normal heart sounds.  Pulmonary/Chest: Effort normal and breath sounds normal.  Abdominal: Soft. Bowel sounds are normal.  Musculoskeletal: Normal range of motion. He exhibits no edema, tenderness or deformity.  Lymphadenopathy:    He has no cervical adenopathy.  Neurological: He is alert and oriented to person, place, and time.  Skin: Skin is warm and dry. No rash noted. No erythema.  Psychiatric: He has a normal mood and affect. His behavior is normal. Judgment and thought content normal.  Vitals reviewed.   Lab Results  Component Value Date   WBC 5.6 03/03/2018   HGB 14.9 09/02/2017   HCT 43.0 03/03/2018   MCV 90.1 03/03/2018   PLT 178 03/03/2018     Chemistry      Component Value Date/Time   NA 140 09/02/2017 0945   K 4.3 09/02/2017 0945   CL 102 03/12/2015 1439   CO2 26 09/02/2017 0945   BUN 15.9 09/02/2017 0945   CREATININE 1.1 09/02/2017 0945      Component Value Date/Time   CALCIUM 9.5 09/02/2017 0945   ALKPHOS 81 09/02/2017 0945  AST 21 09/02/2017 0945   ALT 28 09/02/2017 0945   BILITOT 0.39 09/02/2017 0945         Impression and Plan: [name] is 72 year old gentleman. He has a history of testicular lymphoma. He was treated with systemic chemotherapy with 6 cycles of R.-CHOP. he completed this in May 2013.  He then had radiation therapy to the scrotal region. We then  tried him on intrathecal chemotherapy because of the risk of relapse into the CNS with testicular lymphoma. He did very poorly with this. I think he may have gotten one or 2 treatments.  Everything looks great. I do not see any evidence of recurrent lymphoma.  As such, we will let him go from  the clinic.  I told him that he can always call us if he has any problems.  We will be more than happy to see him back if there are any issues.  It has been a true pleasure being able to help [name].    Volanda Napoleon, MD 4/17/201910:51 AM

## 2018-03-04 LAB — VITAMIN D 25 HYDROXY (VIT D DEFICIENCY, FRACTURES): Vit D, 25-Hydroxy: 35.5 ng/mL (ref 30.0–100.0)

## 2020-07-09 ENCOUNTER — Telehealth: Payer: Self-pay | Admitting: *Deleted

## 2020-07-09 NOTE — Telephone Encounter (Signed)
Message received from patient wanting to know which Covid-19 vaccine Dr. Marin Olp recommends.  Call placed back to patient and patient notified that Dr. Marin Olp prefers the [place] Covid-19 vaccine.  Pt appreciative of call back and has no further questions at this time.

## 2023-08-11 ENCOUNTER — Other Ambulatory Visit: Payer: Self-pay | Admitting: Family Medicine

## 2023-08-11 ENCOUNTER — Ambulatory Visit
Admission: RE | Admit: 2023-08-11 | Discharge: 2023-08-11 | Disposition: A | Discharge status: Home / Self Care | Payer: 59 | Source: Ambulatory Visit | Attending: Family Medicine | Admitting: Family Medicine

## 2023-08-11 DIAGNOSIS — J22 Unspecified acute lower respiratory infection: Secondary | ICD-10-CM

## 2024-08-15 ENCOUNTER — Ambulatory Visit
Admission: RE | Admit: 2024-08-15 | Discharge: 2024-08-15 | Disposition: A | Discharge status: Home / Self Care | Source: Ambulatory Visit | Attending: Family Medicine | Admitting: Family Medicine

## 2024-08-15 ENCOUNTER — Other Ambulatory Visit: Payer: Self-pay | Admitting: Family Medicine

## 2024-08-15 DIAGNOSIS — M25512 Pain in left shoulder: Secondary | ICD-10-CM
